# Patient Record
Sex: Female | Born: 1980 | Race: White | Hispanic: No | Marital: Married | Smoking: Never smoker
Health system: Southern US, Community
[De-identification: ages and names within clinical notes are randomized; demographics above are authoritative.]

## PROBLEM LIST (undated history)

## (undated) DIAGNOSIS — F419 Anxiety disorder, unspecified: Secondary | ICD-10-CM

## (undated) DIAGNOSIS — M5126 Other intervertebral disc displacement, lumbar region: Secondary | ICD-10-CM

## (undated) DIAGNOSIS — I10 Essential (primary) hypertension: Secondary | ICD-10-CM

## (undated) DIAGNOSIS — D649 Anemia, unspecified: Secondary | ICD-10-CM

## (undated) HISTORY — PX: LEEP: SHX91

---

## 1998-05-31 ENCOUNTER — Other Ambulatory Visit: Admission: RE | Admit: 1998-05-31 | Discharge: 1998-05-31 | Payer: Self-pay | Admitting: Obstetrics & Gynecology

## 1998-06-04 ENCOUNTER — Observation Stay (HOSPITAL_COMMUNITY): Admission: AD | Admit: 1998-06-04 | Discharge: 1998-06-04 | Payer: Self-pay | Admitting: Obstetrics and Gynecology

## 1999-07-21 ENCOUNTER — Other Ambulatory Visit: Admission: RE | Admit: 1999-07-21 | Discharge: 1999-07-21 | Payer: Self-pay | Admitting: Obstetrics and Gynecology

## 2010-05-23 ENCOUNTER — Emergency Department (HOSPITAL_BASED_OUTPATIENT_CLINIC_OR_DEPARTMENT_OTHER)
Admission: EM | Admit: 2010-05-23 | Discharge: 2010-05-23 | Disposition: A | Discharge status: Home / Self Care | Payer: 59 | Attending: Emergency Medicine | Admitting: Emergency Medicine

## 2010-05-23 DIAGNOSIS — X58XXXA Exposure to other specified factors, initial encounter: Secondary | ICD-10-CM | POA: Insufficient documentation

## 2010-05-23 DIAGNOSIS — S139XXA Sprain of joints and ligaments of unspecified parts of neck, initial encounter: Secondary | ICD-10-CM | POA: Insufficient documentation

## 2017-04-26 ENCOUNTER — Other Ambulatory Visit: Payer: Self-pay | Admitting: Sports Medicine

## 2017-04-26 DIAGNOSIS — M5416 Radiculopathy, lumbar region: Secondary | ICD-10-CM

## 2017-05-01 ENCOUNTER — Ambulatory Visit
Admission: RE | Admit: 2017-05-01 | Discharge: 2017-05-01 | Disposition: A | Discharge status: Home / Self Care | Payer: Self-pay | Source: Ambulatory Visit | Attending: Sports Medicine | Admitting: Sports Medicine

## 2017-05-01 DIAGNOSIS — M5416 Radiculopathy, lumbar region: Secondary | ICD-10-CM

## 2017-05-02 ENCOUNTER — Other Ambulatory Visit: Payer: Self-pay

## 2017-06-03 ENCOUNTER — Other Ambulatory Visit: Payer: Self-pay

## 2017-06-03 ENCOUNTER — Emergency Department (HOSPITAL_BASED_OUTPATIENT_CLINIC_OR_DEPARTMENT_OTHER)
Admission: EM | Admit: 2017-06-03 | Discharge: 2017-06-03 | Disposition: A | Discharge status: Home / Self Care | Payer: Medicaid Other | Attending: Emergency Medicine | Admitting: Emergency Medicine

## 2017-06-03 ENCOUNTER — Encounter (HOSPITAL_BASED_OUTPATIENT_CLINIC_OR_DEPARTMENT_OTHER): Payer: Self-pay | Admitting: Emergency Medicine

## 2017-06-03 DIAGNOSIS — M5126 Other intervertebral disc displacement, lumbar region: Secondary | ICD-10-CM | POA: Insufficient documentation

## 2017-06-03 DIAGNOSIS — M5416 Radiculopathy, lumbar region: Secondary | ICD-10-CM | POA: Insufficient documentation

## 2017-06-03 DIAGNOSIS — M545 Low back pain: Secondary | ICD-10-CM | POA: Diagnosis present

## 2017-06-03 HISTORY — DX: Other intervertebral disc displacement, lumbar region: M51.26

## 2017-06-03 MED ORDER — HYDROMORPHONE HCL 1 MG/ML IJ SOLN
1.0000 mg | Freq: Once | INTRAMUSCULAR | Status: AC
  Administered 2017-06-03: 1 mg via INTRAVENOUS
  Filled 2017-06-03: qty 1

## 2017-06-03 MED ORDER — GABAPENTIN 300 MG PO CAPS
600.0000 mg | ORAL_CAPSULE | Freq: Once | ORAL | Status: AC
  Administered 2017-06-03: 600 mg via ORAL
  Filled 2017-06-03: qty 2

## 2017-06-03 MED ORDER — ONDANSETRON 4 MG PO TBDP
4.0000 mg | ORAL_TABLET | Freq: Once | ORAL | Status: AC
  Administered 2017-06-03: 4 mg via ORAL
  Filled 2017-06-03: qty 1

## 2017-06-03 MED ORDER — OXYCODONE-ACETAMINOPHEN 5-325 MG PO TABS: 1.0000 | ORAL_TABLET | Freq: Four times a day (QID) | ORAL | 0 refills | Status: DC | PRN

## 2017-06-03 MED ORDER — MELOXICAM 15 MG PO TABS: 15.0000 mg | ORAL_TABLET | Freq: Every day | ORAL | 1 refills | Status: AC

## 2017-06-03 MED ORDER — LIDOCAINE 5 % EX PTCH: 1.0000 | MEDICATED_PATCH | CUTANEOUS | 0 refills | Status: DC

## 2017-06-03 MED ORDER — KETOROLAC TROMETHAMINE 30 MG/ML IJ SOLN
30.0000 mg | Freq: Once | INTRAMUSCULAR | Status: AC
  Administered 2017-06-03: 30 mg via INTRAVENOUS
  Filled 2017-06-03: qty 1

## 2017-06-03 MED ORDER — MORPHINE SULFATE (PF) 4 MG/ML IV SOLN
4.0000 mg | Freq: Once | INTRAVENOUS | Status: AC
  Administered 2017-06-03: 4 mg via INTRAVENOUS
  Filled 2017-06-03: qty 1

## 2017-06-03 MED ORDER — GABAPENTIN 400 MG PO CAPS: 400.0000 mg | ORAL_CAPSULE | Freq: Three times a day (TID) | ORAL | 0 refills | Status: DC

## 2017-06-03 MED ORDER — DIAZEPAM 5 MG/ML IJ SOLN
5.0000 mg | Freq: Once | INTRAMUSCULAR | Status: AC
  Administered 2017-06-03: 5 mg via INTRAVENOUS
  Filled 2017-06-03: qty 2

## 2017-06-03 MED ORDER — SODIUM CHLORIDE 0.9 % IV BOLUS (SEPSIS)
1000.0000 mL | Freq: Once | INTRAVENOUS | Status: AC
  Administered 2017-06-03: 1000 mL via INTRAVENOUS

## 2017-06-03 MED ORDER — MELOXICAM 15 MG PO TABS: 15.0000 mg | ORAL_TABLET | Freq: Every day | ORAL | 1 refills | Status: DC

## 2017-06-03 NOTE — ED Provider Notes (Signed)
[facility] EMERGENCY DEPARTMENT Provider Note   CSN: [id number] Arrival date & time: 06/03/17  1537     History   Chief Complaint Chief Complaint  Patient presents with  . Back Pain    HPI Linda Roberts is a 37 y.o. female with recent diagnosis of 2 disc protrusions at L4-5 and L5-S1 who presents with severe back pain preventing her from walking.  Patient reports she received epidural injection on 05/31/2017 and since then has had severe, worsening pain.  She was evaluated at [facility] hospital on 06/01/2017 and had repeat MRI which showed no abscess and no significant change to patient's first MRI on 05/01/2017 which showed a central disc protrusion with inferior migration at L4-5 contacting the descending right L5 nerve root in the right lateral recess, as well as broad right subarticular disc protrusion at L5-S1 contacting and slightly displacing the descending right S1 nerve root.  Patient has had severe right leg pain associated with her back pain.  She denies any numbness.  She denies any saddle anesthesia, bowel or bladder incontinence, or fevers.  She has been taking ibuprofen 800 mg as well as hydrocodone-acetaminophen 5-325 without any relief.  Patient is also been taking gabapentin, Valium, and prednisone taper.  Patient reports she did not feel much better after her visit on Friday, but was able to walk out of the hospital.  Patient is followed by physical medicine and rehabilitation, Dr. Mariel Craft, and had one surgical consultation with Dr. Leeanne Rio.  They advised physical therapy and attempt to decrease the inflammation.  HPI  Past Medical History:  Diagnosis Date  . Ruptured lumbar disc     There are no active problems to display for this patient.   Past Surgical History:  Procedure Laterality Date  . LEEP      OB History    No data available       Home Medications    Prior to Admission medications   Medication Sig Start Date End Date Taking?  Authorizing Provider  gabapentin (NEURONTIN) 400 MG capsule Take 1 capsule (400 mg total) by mouth 3 (three) times daily. 06/03/17   Kindred Heying, Waylan Boga, PA-C  oxyCODONE-acetaminophen (PERCOCET/ROXICET) 5-325 MG tablet Take 1-2 tablets by mouth every 6 (six) hours as needed for severe pain. 06/03/17   Emi Holes, PA-C    Family History No family history on file.  Social History Social History   Tobacco Use  . Smoking status: Never Smoker  . Smokeless tobacco: Never Used  Substance Use Topics  . Alcohol use: No    Frequency: Never  . Drug use: No     Allergies   Patient has no known allergies.   Review of Systems Review of Systems  Constitutional: Negative for chills and fever.  HENT: Negative for facial swelling and sore throat.   Respiratory: Negative for shortness of breath.   Cardiovascular: Negative for chest pain.  Gastrointestinal: Negative for abdominal pain, nausea and vomiting.  Genitourinary: Negative for dysuria.  Musculoskeletal: Positive for back pain.  Skin: Negative for rash and wound.  Neurological: Negative for numbness and headaches.  Psychiatric/Behavioral: The patient is not nervous/anxious.      Physical Exam Updated Vital Signs BP 112/74   Pulse (!) 47   Temp 98.2 F (36.8 C) (Oral)   Resp 16   Ht 5\' 10"  (1.778 m)   Wt 65.8 kg (145 lb)   SpO2 96%   BMI 20.81 kg/m   Physical Exam  Constitutional:  She appears well-developed and well-nourished. No distress.  HENT:  Head: Normocephalic and atraumatic.  Mouth/Throat: Oropharynx is clear and moist. No oropharyngeal exudate.  Eyes: Conjunctivae are normal. Pupils are equal, round, and reactive to light. Right eye exhibits no discharge. Left eye exhibits no discharge. No scleral icterus.  Neck: Normal range of motion. Neck supple. No thyromegaly present.  Cardiovascular: Normal rate, regular rhythm, normal heart sounds and intact distal pulses. Exam reveals no gallop and no friction rub.  No  murmur heard. Pulmonary/Chest: Effort normal and breath sounds normal. No stridor. No respiratory distress. She has no wheezes. She has no rales.  Abdominal: Soft. Bowel sounds are normal. She exhibits no distension. There is no tenderness. There is no rebound and no guarding.  Musculoskeletal:  Edema from L4-S1, no significant midline tenderness, no erythema or warmth; tenderness to the right gluteal region; 5/5 strength to bilateral lower extremities, 2+ patellar reflexes, sensation intact  Lymphadenopathy:    She has no cervical adenopathy.  Neurological: She is alert. Coordination normal.  Skin: Skin is warm and dry. No rash noted. She is not diaphoretic. No pallor.  Psychiatric: She has a normal mood and affect.  Nursing note and vitals reviewed.    ED Treatments / Results  Labs (all labs ordered are listed, but only abnormal results are displayed) Labs Reviewed - No data to display  EKG  EKG Interpretation None       Radiology No results found.  Procedures Procedures (including critical care time)  Medications Ordered in ED Medications  morphine 4 MG/ML injection 4 mg (not administered)  sodium chloride 0.9 % bolus 1,000 mL (0 mLs Intravenous Stopped 06/03/17 1753)  HYDROmorphone (DILAUDID) injection 1 mg (1 mg Intravenous Given 06/03/17 1626)  gabapentin (NEURONTIN) capsule 600 mg (600 mg Oral Given 06/03/17 1903)  diazepam (VALIUM) injection 5 mg (5 mg Intravenous Given 06/03/17 1901)  HYDROmorphone (DILAUDID) injection 1 mg (1 mg Intravenous Given 06/03/17 1939)  ketorolac (TORADOL) 30 MG/ML injection 30 mg (30 mg Intravenous Given 06/03/17 2046)  ondansetron (ZOFRAN-ODT) disintegrating tablet 4 mg (4 mg Oral Given 06/03/17 2121)     Initial Impression / Assessment and Plan / ED Course  I have reviewed the triage vital signs and the nursing notes.  Pertinent labs & imaging results that were available during my care of the patient were reviewed by me and considered in  my medical decision making (see chart for details).     Patient with chronic back pain and R leg pain acutely worsening throughout the week this past week causing her to not be able to walk. Patient had a repeat MRI in the ED at [facility] 2 days ago, after she received an epidural injection 3 days ago, that showed mid increase of protrusion, however otherwise essentially the same as 1 month ago. Today, patient's exam is very reassuring. Strength is 5/5, sensation intact, patellar reflexes intact. No saddle anesthesia or bowel/bladder incontinence. Afebrile. Patient's pain very difficult to control in the ED with Dilaudid, valium, Toradol, and gabapentin. Plan to ambulate and attempt discharge home but patient would like to wait a little while longer to see if the medication helps a little more. At shift change, patient care transferred to [facility], PA-C for continued evaluation, and determination of disposition. Anticipate discharge if patient able to ambulate. Plan to discharge with increase in dose of gabapentin 400mg  TID and Percocet instead of Norco. I reviewed the [place] narcotic database and found no discrepancies. I discussed patient case  with Dr. Dalene SeltzerSchlossman who guided the patient's management and agrees with plan.  Final Clinical Impressions(s) / ED Diagnoses   Final diagnoses:  Protrusion of lumbar intervertebral disc  Lumbar radiculopathy    ED Discharge Orders        Ordered    gabapentin (NEURONTIN) 400 MG capsule  3 times daily,   Status:  Discontinued     06/03/17 2059    oxyCODONE-acetaminophen (PERCOCET/ROXICET) 5-325 MG tablet  Every 6 hours PRN,   Status:  Discontinued     06/03/17 2059    gabapentin (NEURONTIN) 400 MG capsule  3 times daily     06/03/17 2105    oxyCODONE-acetaminophen (PERCOCET/ROXICET) 5-325 MG tablet  Every 6 hours PRN     06/03/17 2105       Emi HolesLaw, Celeste Tavenner M, PA-C 06/03/17 2302    Alvira MondaySchlossman, Erin, MD 06/04/17 1127

## 2017-06-03 NOTE — ED Triage Notes (Signed)
Pt to ED via EMS with c/o back pain; unable to ambulate; received Zofran 4mg  and Fentanyl 100mcg IV; NS 250 ml bolus

## 2017-06-03 NOTE — ED Notes (Signed)
Continue to c/o pain to here lower back that radiates to her right leg.

## 2017-06-03 NOTE — ED Notes (Signed)
Pt ambulated to door frame with assistance of EMT. Pt in excruciating pain, 10/10. Pt complains of nausea and started vomiting as soon as EMT left room. PA aware and putting in orders.

## 2017-06-03 NOTE — ED Notes (Signed)
ED Provider at bedside. 

## 2017-06-03 NOTE — Discharge Instructions (Signed)
You were seen here today for back pain.  I am increasing your Norco to C.H. Robinson WorldwidePerocet. Take as directed.  I am increasing your dose of Gabapentin.  I am changing you from Ibuprofen to Mobic. This is a one daily NSAID.  Continue your steroids at home.  Use lidocaine patches as directed.  Please call your surgeon tomorrow for follow up.  Return for any fever, loss of bowel or bladder function, new weakness or numbness of the lower extremity.

## 2017-06-03 NOTE — ED Notes (Signed)
Patient is able to ben her legs inward but stated that she can not raise it up.  She is in excruciating pain to her right calf.

## 2017-06-03 NOTE — ED Provider Notes (Signed)
Patient signed out to me by Glenford Bayley, PA-C.  Please see her note for further details.  Briefly this is a 37 year old female who was recently diagnosed with 2 bulging disc protrusions at levels of L4-5 and L5-1 who presents with severe back pain, greater on the right with radiation down her right leg. Patient is currently followed by physical medicine and rehabilitation by Dr. Mariel Craft and neurosuergy, Dr. Leeanne Rio.  Patient's current pain medication regimen includes ibuprofen 800 mg, hydrocodone-acetaminophen 5-325, gabapentin, Valium and is currently on a prednisone taper.  Patient did receive a epidural injection on 3/7.  She had MRI on 3/8 that did not show evidence of an abscess or epidural hematoma.  Patient notes that her pain has been increasing over the last several days.  She states that this is been a slow progression, with incidence of bending down to pick up her cat or child making her symptoms worse.  Her pain is most severe with walking.  The patient denies any new numbness/weakness, bowel/bladder incontinence, urinary retention or saddle anesthesia.  She denies any associated fever, IV drug use, abdominal pain, nausea/vomiting or urinary symptoms.  At time of signout the patient care was transferred to myself.  Anticipation of discharge if patient is able to ambulate.  Plan was to discharge the patient with increase in dose of gabapentin 400 mg 3 times daily and to take Percocet instead of Norco.  The case was discussed with Dr. Dalene Seltzer prior to handout.  Physical Exam  BP 112/71 (BP Location: Left Arm)   Pulse 72   Temp 98.2 F (36.8 C) (Oral)   Resp 18   Ht 5\' 10"  (1.778 m)   Wt 65.8 kg (145 lb)   SpO2 100%   BMI 20.81 kg/m   Physical Exam  Constitutional: She appears well-developed and well-nourished. No distress.  Non-toxic appearing.  She is lying on the exam table and appears uncomfortable but in no acute distress.  HENT:  Head: Normocephalic and atraumatic.  Right Ear:  External ear normal.  Left Ear: External ear normal.  Neck: Normal range of motion. Neck supple. No spinous process tenderness present. No neck rigidity. Normal range of motion present.  Cardiovascular: Normal rate, regular rhythm, normal heart sounds and intact distal pulses.  No murmur heard. Pulses:      Radial pulses are 2+ on the right side, and 2+ on the left side.       Femoral pulses are 2+ on the right side, and 2+ on the left side.      Dorsalis pedis pulses are 2+ on the right side, and 2+ on the left side.       Posterior tibial pulses are 2+ on the right side, and 2+ on the left side.  Pulmonary/Chest: Effort normal and breath sounds normal. No respiratory distress.  Abdominal: Soft. Bowel sounds are normal. She exhibits no pulsatile midline mass. There is no tenderness. There is no rigidity, no rebound and no CVA tenderness.  Musculoskeletal:  Posterior and appearance appears normal. No evidence of obvious scoliosis or kyphosis. No obvious signs of skin changes, trauma, deformity, infection.  There is some moderate edema at the level of L4-L5, and L5-S1.  No C, T, or L spine tenderness or step-offs to palpation. No C, T  paraspinal tenderness.  There is diffuse lumbar tenderness bilaterally over the right gluteal region.  Lung expansion normal. Bilateral lower extremity strength 5 out of 5. Patellar and Achilles deep tendon reflex 2+ and equal bilaterally.  Sensation of lower extremities grossly intact. Gait able with mild assistance from husband and patient notes painful. Lower extremity compartments soft. PT and DP 2+ b/l. Cap refill <2 seconds.   Neurological: She is alert.  Skin: Skin is warm, dry and intact. Capillary refill takes less than 2 seconds. No rash noted. She is not diaphoretic. No erythema.  Nursing note and vitals reviewed.   ED Course/Procedures     Procedures No results found for this or any previous visit. No results found.   MDM    Briefly this is a  37 year old female who was recently diagnosed with 2 bulging disc protrusions at levels of L4-5 and L5-1 who presents with severe back pain, greater on the right with radiation down her right leg. Patient is currently followed by physical medicine and rehabilitation by Dr. Mariel CraftBegovich and neurosuergy, Dr. Leeanne Rio'Gara.  Patient's current pain medication regimen includes ibuprofen 800 mg, hydrocodone-acetaminophen 5-325, gabapentin, Valium and is currently on a prednisone taper.  Patient did receive a epidural injection on 3/7.  She had MRI on 3/8 that did not show evidence of an abscess or epidural hematoma.  Patient notes that her pain has been increasing over the last several days.  She states that this is been a slow progression, with incidence of bending down to pick up her cat or child making her symptoms worse.  Her pain is most severe with walking.  The patient denies any new numbness/weakness, bowel/bladder incontinence, urinary retention or saddle anesthesia.  She denies any associated fever, IV drug use, abdominal pain, nausea/vomiting or urinary symptoms.  At time of signout the patient care was transferred to myself.  Anticipation of discharge if patient is able to ambulate.  Plan was to discharge the patient with increase in dose of gabapentin 400 mg 3 times daily and to take Percocet instead of Norco.  The case was discussed with Dr. Dalene SeltzerSchlossman prior to handout.  On my exam there are no neurologic deficits and normal neuro exam.  Patient can walk but states it is painful.  No bowel or bladder incontinence.  No urinary retention or saddle anesthesia.  No concern for cauda equina.  No history of trauma, personal history of cancer, night sweats or weight loss that would warrant a x-ray at this time.  Patient has had several imagings done recently.  Patient has had spinal injections on 3/7 but has had a negative MRI for abscess or epidural hematoma since that time.  No fever or history of IV drug use.  Very low  suspicion for abscess at this time. No pulsatile mass of the abdomen.  No abdominal tenderness to palpation or guarding to make me concern for intra-abdominal pathology.    Patient back pain treated with several doses of pain medication in the department.  This provided her some relief and she was able to ambulate with mild to no assistance.  She tells me that ibuprofen is most helpful for her pain but often finds it to be a hassle to take 3 times per day.  Will change her to once daily Mobic.  As above plan will change patient from Norco to Percocet.  Will increase her dose of gabapentin.  Will also provide lidocaine patches.  Patient and patient's husband are in agreement with plan.  She is to call their neurosurgeon tomorrow discussed that there is seen here today so they can be scheduled for follow-up. Specific return precautions discussed. Time was given for all questions to be answered. The patient  verbalized understanding and agreement with plan. The patient appears safe for discharge home.     Jacinto Halim, PA-C 06/04/17 0120    Alvira Monday, MD 06/04/17 [id number]

## 2017-06-05 ENCOUNTER — Other Ambulatory Visit: Payer: Self-pay | Admitting: Neurosurgery

## 2017-06-08 ENCOUNTER — Other Ambulatory Visit: Payer: Self-pay

## 2017-06-08 ENCOUNTER — Encounter (HOSPITAL_COMMUNITY): Payer: Self-pay | Admitting: *Deleted

## 2017-06-08 NOTE — Progress Notes (Signed)
Spoke with pt for pre-op call. Pt denies cardiac history. Pt states she had HTN when she was 37 years old. States she never took medication, but with weight loss and decrease sodium intake she no longer has any problems.

## 2017-06-11 ENCOUNTER — Ambulatory Visit (HOSPITAL_COMMUNITY)
Admission: RE | Disposition: A | Discharge status: Home / Self Care | Payer: Self-pay | Source: Ambulatory Visit | Attending: Neurosurgery

## 2017-06-11 ENCOUNTER — Ambulatory Visit (HOSPITAL_COMMUNITY): Payer: Medicaid Other | Admitting: Certified Registered Nurse Anesthetist

## 2017-06-11 ENCOUNTER — Ambulatory Visit (HOSPITAL_COMMUNITY)
Admission: RE | Admit: 2017-06-11 | Discharge: 2017-06-12 | Disposition: A | Discharge status: Home / Self Care | Payer: Medicaid Other | Source: Ambulatory Visit | Attending: Neurosurgery | Admitting: Neurosurgery

## 2017-06-11 ENCOUNTER — Ambulatory Visit (HOSPITAL_COMMUNITY): Payer: Medicaid Other

## 2017-06-11 ENCOUNTER — Encounter (HOSPITAL_COMMUNITY): Payer: Self-pay | Admitting: Surgery

## 2017-06-11 ENCOUNTER — Other Ambulatory Visit: Payer: Self-pay

## 2017-06-11 DIAGNOSIS — F419 Anxiety disorder, unspecified: Secondary | ICD-10-CM | POA: Insufficient documentation

## 2017-06-11 DIAGNOSIS — M4807 Spinal stenosis, lumbosacral region: Secondary | ICD-10-CM | POA: Insufficient documentation

## 2017-06-11 DIAGNOSIS — Z793 Long term (current) use of hormonal contraceptives: Secondary | ICD-10-CM | POA: Insufficient documentation

## 2017-06-11 DIAGNOSIS — M5126 Other intervertebral disc displacement, lumbar region: Secondary | ICD-10-CM | POA: Diagnosis present

## 2017-06-11 DIAGNOSIS — M5127 Other intervertebral disc displacement, lumbosacral region: Secondary | ICD-10-CM | POA: Diagnosis not present

## 2017-06-11 DIAGNOSIS — Z79899 Other long term (current) drug therapy: Secondary | ICD-10-CM | POA: Insufficient documentation

## 2017-06-11 DIAGNOSIS — Z419 Encounter for procedure for purposes other than remedying health state, unspecified: Secondary | ICD-10-CM

## 2017-06-11 HISTORY — DX: Anemia, unspecified: D64.9

## 2017-06-11 HISTORY — DX: Anxiety disorder, unspecified: F41.9

## 2017-06-11 HISTORY — DX: Essential (primary) hypertension: I10

## 2017-06-11 HISTORY — PX: LUMBAR LAMINECTOMY/DECOMPRESSION MICRODISCECTOMY: SHX5026

## 2017-06-11 LAB — BASIC METABOLIC PANEL
ANION GAP: 12 (ref 5–15)
BUN: 13 mg/dL (ref 6–20)
CO2: 25 mmol/L (ref 22–32)
CREATININE: 0.79 mg/dL (ref 0.44–1.00)
Calcium: 9 mg/dL (ref 8.9–10.3)
Chloride: 102 mmol/L (ref 101–111)
GFR calc Af Amer: >60 mL/min (ref >60)
GLUCOSE: 85 mg/dL (ref 65–99)
Potassium: 4.4 mmol/L (ref 3.5–5.1)
Sodium: 139 mmol/L (ref 135–145)

## 2017-06-11 LAB — CBC
HCT: 42.3 % (ref 36.0–46.0)
Hemoglobin: 14.2 g/dL (ref 12.0–15.0)
MCH: 31.1 pg (ref 26.0–34.0)
MCHC: 33.6 g/dL (ref 30.0–36.0)
MCV: 92.8 fL (ref 78.0–100.0)
Platelets: 338 10*3/uL (ref 150–400)
RBC: 4.56 MIL/uL (ref 3.87–5.11)
RDW: 13.6 % (ref 11.5–15.5)
WBC: 16.4 10*3/uL — ABNORMAL HIGH (ref 4.0–10.5)

## 2017-06-11 LAB — POCT PREGNANCY, URINE: Preg Test, Ur: NEGATIVE

## 2017-06-11 SURGERY — LUMBAR LAMINECTOMY/DECOMPRESSION MICRODISCECTOMY 1 LEVEL
Anesthesia: General | Site: Back | Laterality: Right

## 2017-06-11 MED ORDER — ROCURONIUM BROMIDE 10 MG/ML (PF) SYRINGE
PREFILLED_SYRINGE | INTRAVENOUS | Status: AC
  Filled 2017-06-11: qty 5

## 2017-06-11 MED ORDER — ALUM & MAG HYDROXIDE-SIMETH 200-200-20 MG/5ML PO SUSP: 30.0000 mL | Freq: Four times a day (QID) | ORAL | Status: DC | PRN

## 2017-06-11 MED ORDER — SUGAMMADEX SODIUM 200 MG/2ML IV SOLN
INTRAVENOUS | Status: DC | PRN
  Administered 2017-06-11: 200 mg via INTRAVENOUS

## 2017-06-11 MED ORDER — BUPIVACAINE HCL (PF) 0.25 % IJ SOLN
INTRAMUSCULAR | Status: AC
  Filled 2017-06-11: qty 30

## 2017-06-11 MED ORDER — SCOPOLAMINE 1 MG/3DAYS TD PT72
1.0000 | MEDICATED_PATCH | Freq: Once | TRANSDERMAL | Status: DC
  Administered 2017-06-11: 1.5 mg via TRANSDERMAL

## 2017-06-11 MED ORDER — HYDROMORPHONE HCL 1 MG/ML IJ SOLN: 0.5000 mg | INTRAMUSCULAR | Status: DC | PRN

## 2017-06-11 MED ORDER — HYDROMORPHONE HCL 1 MG/ML IJ SOLN
0.2500 mg | INTRAMUSCULAR | Status: DC | PRN
  Administered 2017-06-11: 0.5 mg via INTRAVENOUS

## 2017-06-11 MED ORDER — LEVONORGESTREL 20 MCG/24HR IU IUD: 1.0000 | INTRAUTERINE_SYSTEM | Freq: Once | INTRAUTERINE | Status: DC

## 2017-06-11 MED ORDER — SODIUM CHLORIDE 0.9 % IV SOLN: 250.0000 mL | INTRAVENOUS | Status: DC

## 2017-06-11 MED ORDER — CHLORHEXIDINE GLUCONATE CLOTH 2 % EX PADS: 6.0000 | MEDICATED_PAD | Freq: Once | CUTANEOUS | Status: DC

## 2017-06-11 MED ORDER — FENTANYL CITRATE (PF) 100 MCG/2ML IJ SOLN
INTRAMUSCULAR | Status: DC | PRN
  Administered 2017-06-11: 50 ug via INTRAVENOUS
  Administered 2017-06-11 (×2): 100 ug via INTRAVENOUS

## 2017-06-11 MED ORDER — SCOPOLAMINE 1 MG/3DAYS TD PT72
MEDICATED_PATCH | TRANSDERMAL | Status: AC
  Administered 2017-06-11: 1.5 mg via TRANSDERMAL
  Filled 2017-06-11: qty 1

## 2017-06-11 MED ORDER — HYDROMORPHONE HCL 1 MG/ML IJ SOLN
INTRAMUSCULAR | Status: AC
  Filled 2017-06-11: qty 1

## 2017-06-11 MED ORDER — GABAPENTIN 400 MG PO CAPS
400.0000 mg | ORAL_CAPSULE | Freq: Three times a day (TID) | ORAL | Status: DC
  Administered 2017-06-11: 400 mg via ORAL
  Filled 2017-06-11: qty 1

## 2017-06-11 MED ORDER — ROCURONIUM BROMIDE 100 MG/10ML IV SOLN
INTRAVENOUS | Status: DC | PRN
  Administered 2017-06-11: 50 mg via INTRAVENOUS

## 2017-06-11 MED ORDER — CEFAZOLIN SODIUM-DEXTROSE 2-4 GM/100ML-% IV SOLN
2.0000 g | Freq: Three times a day (TID) | INTRAVENOUS | Status: DC
  Administered 2017-06-12: 2 g via INTRAVENOUS
  Filled 2017-06-11: qty 100

## 2017-06-11 MED ORDER — OXYCODONE HCL 5 MG/5ML PO SOLN: 5.0000 mg | Freq: Once | ORAL | Status: DC | PRN

## 2017-06-11 MED ORDER — MELOXICAM 7.5 MG PO TABS: 15.0000 mg | ORAL_TABLET | Freq: Every day | ORAL | Status: DC

## 2017-06-11 MED ORDER — DEXAMETHASONE SODIUM PHOSPHATE 10 MG/ML IJ SOLN
10.0000 mg | INTRAMUSCULAR | Status: DC
  Filled 2017-06-11: qty 1

## 2017-06-11 MED ORDER — MEPERIDINE HCL 50 MG/ML IJ SOLN: 6.2500 mg | INTRAMUSCULAR | Status: DC | PRN

## 2017-06-11 MED ORDER — LACTATED RINGERS IV SOLN
INTRAVENOUS | Status: DC
  Administered 2017-06-11 (×3): via INTRAVENOUS

## 2017-06-11 MED ORDER — ACETAMINOPHEN 325 MG PO TABS: 650.0000 mg | ORAL_TABLET | ORAL | Status: DC | PRN

## 2017-06-11 MED ORDER — ONDANSETRON HCL 4 MG/2ML IJ SOLN
INTRAMUSCULAR | Status: AC
  Filled 2017-06-11: qty 2

## 2017-06-11 MED ORDER — CYCLOBENZAPRINE HCL 10 MG PO TABS
10.0000 mg | ORAL_TABLET | Freq: Three times a day (TID) | ORAL | Status: DC | PRN
  Administered 2017-06-11: 10 mg via ORAL
  Filled 2017-06-11 (×2): qty 1

## 2017-06-11 MED ORDER — ONDANSETRON HCL 4 MG/2ML IJ SOLN: 4.0000 mg | Freq: Once | INTRAMUSCULAR | Status: DC | PRN

## 2017-06-11 MED ORDER — OXYCODONE HCL 5 MG PO TABS: 5.0000 mg | ORAL_TABLET | Freq: Once | ORAL | Status: DC | PRN

## 2017-06-11 MED ORDER — OXYCODONE-ACETAMINOPHEN 5-325 MG PO TABS: 1.0000 | ORAL_TABLET | Freq: Four times a day (QID) | ORAL | Status: DC | PRN

## 2017-06-11 MED ORDER — PROPOFOL 10 MG/ML IV BOLUS
INTRAVENOUS | Status: DC | PRN
  Administered 2017-06-11: 160 mg via INTRAVENOUS

## 2017-06-11 MED ORDER — HYDROMORPHONE HCL 1 MG/ML IJ SOLN
0.2500 mg | INTRAMUSCULAR | Status: DC | PRN
  Administered 2017-06-11 (×3): 0.5 mg via INTRAVENOUS

## 2017-06-11 MED ORDER — ONDANSETRON HCL 4 MG/2ML IJ SOLN: 4.0000 mg | Freq: Four times a day (QID) | INTRAMUSCULAR | Status: DC | PRN

## 2017-06-11 MED ORDER — LIDOCAINE-EPINEPHRINE 1 %-1:100000 IJ SOLN
INTRAMUSCULAR | Status: AC
Start: 2017-06-11 — End: ?
  Filled 2017-06-11: qty 1

## 2017-06-11 MED ORDER — CEFAZOLIN SODIUM-DEXTROSE 2-4 GM/100ML-% IV SOLN
2.0000 g | INTRAVENOUS | Status: AC
  Administered 2017-06-11: 2 g via INTRAVENOUS
  Filled 2017-06-11: qty 100

## 2017-06-11 MED ORDER — OXYCODONE HCL 5 MG PO TABS
10.0000 mg | ORAL_TABLET | ORAL | Status: DC | PRN
  Administered 2017-06-11 – 2017-06-12 (×4): 10 mg via ORAL
  Filled 2017-06-11 (×4): qty 2

## 2017-06-11 MED ORDER — MIDAZOLAM HCL 2 MG/2ML IJ SOLN
INTRAMUSCULAR | Status: AC
  Filled 2017-06-11: qty 2

## 2017-06-11 MED ORDER — ACETAMINOPHEN 650 MG RE SUPP: 650.0000 mg | RECTAL | Status: DC | PRN

## 2017-06-11 MED ORDER — FENTANYL CITRATE (PF) 100 MCG/2ML IJ SOLN
50.0000 ug | Freq: Once | INTRAMUSCULAR | Status: AC
  Administered 2017-06-11: 50 ug via INTRAVENOUS

## 2017-06-11 MED ORDER — PANTOPRAZOLE SODIUM 40 MG IV SOLR
40.0000 mg | Freq: Every day | INTRAVENOUS | Status: DC
  Administered 2017-06-11: 40 mg via INTRAVENOUS
  Filled 2017-06-11: qty 40

## 2017-06-11 MED ORDER — FENTANYL CITRATE (PF) 100 MCG/2ML IJ SOLN
INTRAMUSCULAR | Status: AC
  Administered 2017-06-11: 50 ug via INTRAVENOUS
  Filled 2017-06-11: qty 2

## 2017-06-11 MED ORDER — THROMBIN (RECOMBINANT) 5000 UNITS EX SOLR
CUTANEOUS | Status: DC | PRN
  Administered 2017-06-11 (×2): 5000 [IU] via TOPICAL

## 2017-06-11 MED ORDER — ROCURONIUM BROMIDE 10 MG/ML (PF) SYRINGE
PREFILLED_SYRINGE | INTRAVENOUS | Status: AC
  Filled 2017-06-11: qty 10

## 2017-06-11 MED ORDER — MENTHOL 3 MG MT LOZG: 1.0000 | LOZENGE | OROMUCOSAL | Status: DC | PRN

## 2017-06-11 MED ORDER — PREDNISONE 10 MG PO TABS: 10.0000 mg | ORAL_TABLET | Freq: Every day | ORAL | Status: DC

## 2017-06-11 MED ORDER — LIDOCAINE HCL (CARDIAC) 20 MG/ML IV SOLN
INTRAVENOUS | Status: AC
Start: 2017-06-11 — End: ?
  Filled 2017-06-11: qty 5

## 2017-06-11 MED ORDER — THROMBIN 5000 UNITS EX SOLR
CUTANEOUS | Status: AC
Start: 2017-06-11 — End: ?
  Filled 2017-06-11: qty 10000

## 2017-06-11 MED ORDER — PROMETHAZINE HCL 25 MG/ML IJ SOLN: 6.2500 mg | INTRAMUSCULAR | Status: DC | PRN

## 2017-06-11 MED ORDER — PREDNISONE 10 MG (21) PO TBPK: 20.0000 mg | ORAL_TABLET | Freq: Every day | ORAL | Status: DC

## 2017-06-11 MED ORDER — HEMOSTATIC AGENTS (NO CHARGE) OPTIME
TOPICAL | Status: DC | PRN
  Administered 2017-06-11: 1 via TOPICAL

## 2017-06-11 MED ORDER — SUGAMMADEX SODIUM 200 MG/2ML IV SOLN
INTRAVENOUS | Status: AC
  Filled 2017-06-11: qty 2

## 2017-06-11 MED ORDER — BUPIVACAINE HCL (PF) 0.25 % IJ SOLN
INTRAMUSCULAR | Status: DC | PRN
  Administered 2017-06-11: 10 mL

## 2017-06-11 MED ORDER — SODIUM CHLORIDE 0.9% FLUSH: 3.0000 mL | INTRAVENOUS | Status: DC | PRN

## 2017-06-11 MED ORDER — LIDOCAINE HCL (CARDIAC) 20 MG/ML IV SOLN
INTRAVENOUS | Status: AC
  Filled 2017-06-11: qty 5

## 2017-06-11 MED ORDER — ARTIFICIAL TEARS OPHTHALMIC OINT
TOPICAL_OINTMENT | OPHTHALMIC | Status: AC
  Filled 2017-06-11: qty 10.5

## 2017-06-11 MED ORDER — SODIUM CHLORIDE 0.9% FLUSH: 3.0000 mL | Freq: Two times a day (BID) | INTRAVENOUS | Status: DC

## 2017-06-11 MED ORDER — DEXAMETHASONE SODIUM PHOSPHATE 10 MG/ML IJ SOLN
INTRAMUSCULAR | Status: DC | PRN
Start: 2017-06-11 — End: 2017-06-11
  Administered 2017-06-11: 10 mg via INTRAVENOUS

## 2017-06-11 MED ORDER — MIDAZOLAM HCL 2 MG/2ML IJ SOLN
INTRAMUSCULAR | Status: DC | PRN
  Administered 2017-06-11: 2 mg via INTRAVENOUS

## 2017-06-11 MED ORDER — MIDAZOLAM HCL 2 MG/2ML IJ SOLN: 0.5000 mg | Freq: Once | INTRAMUSCULAR | Status: DC | PRN

## 2017-06-11 MED ORDER — 0.9 % SODIUM CHLORIDE (POUR BTL) OPTIME
TOPICAL | Status: DC | PRN
  Administered 2017-06-11: 1000 mL

## 2017-06-11 MED ORDER — LIDOCAINE 5 % EX PTCH
1.0000 | MEDICATED_PATCH | Freq: Every day | CUTANEOUS | Status: DC
  Filled 2017-06-11: qty 1

## 2017-06-11 MED ORDER — FENTANYL CITRATE (PF) 100 MCG/2ML IJ SOLN
INTRAMUSCULAR | Status: AC
  Filled 2017-06-11: qty 2

## 2017-06-11 MED ORDER — FENTANYL CITRATE (PF) 100 MCG/2ML IJ SOLN
50.0000 ug | Freq: Once | INTRAMUSCULAR | Status: AC
Start: 2017-06-11 — End: 2017-06-11
  Administered 2017-06-11: 50 ug via INTRAVENOUS

## 2017-06-11 MED ORDER — ONDANSETRON HCL 4 MG PO TABS: 4.0000 mg | ORAL_TABLET | Freq: Four times a day (QID) | ORAL | Status: DC | PRN

## 2017-06-11 MED ORDER — PHENOL 1.4 % MT LIQD: 1.0000 | OROMUCOSAL | Status: DC | PRN

## 2017-06-11 MED ORDER — LIDOCAINE 2% (20 MG/ML) 5 ML SYRINGE
INTRAMUSCULAR | Status: DC | PRN
  Administered 2017-06-11: 50 mg via INTRAVENOUS

## 2017-06-11 MED ORDER — FENTANYL CITRATE (PF) 250 MCG/5ML IJ SOLN
INTRAMUSCULAR | Status: AC
  Filled 2017-06-11: qty 5

## 2017-06-11 MED ORDER — LIDOCAINE-EPINEPHRINE 1 %-1:100000 IJ SOLN
INTRAMUSCULAR | Status: DC | PRN
  Administered 2017-06-11: 10 mL via INTRADERMAL

## 2017-06-11 MED ORDER — ONDANSETRON HCL 4 MG/2ML IJ SOLN
INTRAMUSCULAR | Status: DC | PRN
  Administered 2017-06-11: 4 mg via INTRAVENOUS

## 2017-06-11 MED ORDER — DEXAMETHASONE SODIUM PHOSPHATE 10 MG/ML IJ SOLN
INTRAMUSCULAR | Status: AC
  Filled 2017-06-11: qty 1

## 2017-06-11 MED ORDER — SODIUM CHLORIDE 0.9 % IR SOLN
Status: DC | PRN
  Administered 2017-06-11: 500 mL

## 2017-06-11 SURGICAL SUPPLY — 62 items
BAG DECANTER FOR FLEXI CONT (MISCELLANEOUS) ×3 IMPLANT
BENZOIN TINCTURE PRP APPL 2/3 (GAUZE/BANDAGES/DRESSINGS) ×3 IMPLANT
BLADE CLIPPER SURG (BLADE) IMPLANT
BLADE SURG 11 STRL SS (BLADE) ×3 IMPLANT
BUR CUTTER 7.0 ROUND (BURR) ×3 IMPLANT
BUR MATCHSTICK NEURO 3.0 LAGG (BURR) ×3 IMPLANT
CANISTER SUCT 3000ML PPV (MISCELLANEOUS) ×3 IMPLANT
CARTRIDGE OIL MAESTRO DRILL (MISCELLANEOUS) ×1 IMPLANT
CLOSURE STERI-STRIP 1/2X4 (GAUZE/BANDAGES/DRESSINGS) ×1
CLOSURE WOUND 1/2 X4 (GAUZE/BANDAGES/DRESSINGS) ×1
CLSR STERI-STRIP ANTIMIC 1/2X4 (GAUZE/BANDAGES/DRESSINGS) ×2 IMPLANT
DECANTER SPIKE VIAL GLASS SM (MISCELLANEOUS) ×6 IMPLANT
DERMABOND ADVANCED (GAUZE/BANDAGES/DRESSINGS) ×2
DERMABOND ADVANCED .7 DNX12 (GAUZE/BANDAGES/DRESSINGS) ×1 IMPLANT
DIFFUSER DRILL AIR PNEUMATIC (MISCELLANEOUS) ×3 IMPLANT
DRAPE HALF SHEET 40X57 (DRAPES) IMPLANT
DRAPE LAPAROTOMY 100X72X124 (DRAPES) ×3 IMPLANT
DRAPE MICROSCOPE LEICA (MISCELLANEOUS) ×3 IMPLANT
DRAPE SURG 17X23 STRL (DRAPES) ×3 IMPLANT
DRSG OPSITE POSTOP 4X6 (GAUZE/BANDAGES/DRESSINGS) ×3 IMPLANT
DURAPREP 26ML APPLICATOR (WOUND CARE) ×3 IMPLANT
ELECT REM PT RETURN 9FT ADLT (ELECTROSURGICAL) ×3
ELECTRODE REM PT RTRN 9FT ADLT (ELECTROSURGICAL) ×1 IMPLANT
GAUZE SPONGE 4X4 12PLY STRL (GAUZE/BANDAGES/DRESSINGS) IMPLANT
GAUZE SPONGE 4X4 16PLY XRAY LF (GAUZE/BANDAGES/DRESSINGS) IMPLANT
GLOVE BIO SURGEON STRL SZ7 (GLOVE) IMPLANT
GLOVE BIO SURGEON STRL SZ8 (GLOVE) ×3 IMPLANT
GLOVE BIOGEL PI IND STRL 6.5 (GLOVE) ×1 IMPLANT
GLOVE BIOGEL PI IND STRL 7.0 (GLOVE) IMPLANT
GLOVE BIOGEL PI IND STRL 7.5 (GLOVE) ×1 IMPLANT
GLOVE BIOGEL PI INDICATOR 6.5 (GLOVE) ×2
GLOVE BIOGEL PI INDICATOR 7.0 (GLOVE)
GLOVE BIOGEL PI INDICATOR 7.5 (GLOVE) ×2
GLOVE ECLIPSE 7.0 STRL STRAW (GLOVE) ×3 IMPLANT
GLOVE EXAM NITRILE LRG STRL (GLOVE) IMPLANT
GLOVE EXAM NITRILE XL STR (GLOVE) IMPLANT
GLOVE EXAM NITRILE XS STR PU (GLOVE) IMPLANT
GLOVE INDICATOR 8.5 STRL (GLOVE) ×3 IMPLANT
GLOVE SURG SS PI 6.0 STRL IVOR (GLOVE) ×3 IMPLANT
GLOVE SURG SS PI 7.0 STRL IVOR (GLOVE) ×3 IMPLANT
GOWN STRL REUS W/ TWL LRG LVL3 (GOWN DISPOSABLE) ×2 IMPLANT
GOWN STRL REUS W/ TWL XL LVL3 (GOWN DISPOSABLE) ×1 IMPLANT
GOWN STRL REUS W/TWL 2XL LVL3 (GOWN DISPOSABLE) IMPLANT
GOWN STRL REUS W/TWL LRG LVL3 (GOWN DISPOSABLE) ×4
GOWN STRL REUS W/TWL XL LVL3 (GOWN DISPOSABLE) ×2
KIT BASIN OR (CUSTOM PROCEDURE TRAY) ×3 IMPLANT
KIT ROOM TURNOVER OR (KITS) ×3 IMPLANT
NEEDLE HYPO 22GX1.5 SAFETY (NEEDLE) ×3 IMPLANT
NEEDLE SPNL 22GX3.5 QUINCKE BK (NEEDLE) ×3 IMPLANT
NS IRRIG 1000ML POUR BTL (IV SOLUTION) ×3 IMPLANT
OIL CARTRIDGE MAESTRO DRILL (MISCELLANEOUS) ×3
PACK LAMINECTOMY NEURO (CUSTOM PROCEDURE TRAY) ×3 IMPLANT
RUBBERBAND STERILE (MISCELLANEOUS) ×6 IMPLANT
SPONGE SURGIFOAM ABS GEL SZ50 (HEMOSTASIS) ×3 IMPLANT
STRIP CLOSURE SKIN 1/2X4 (GAUZE/BANDAGES/DRESSINGS) ×2 IMPLANT
SUT VIC AB 0 CT1 18XCR BRD8 (SUTURE) ×1 IMPLANT
SUT VIC AB 0 CT1 8-18 (SUTURE) ×2
SUT VIC AB 2-0 CT1 18 (SUTURE) ×3 IMPLANT
SUT VICRYL 4-0 PS2 18IN ABS (SUTURE) ×3 IMPLANT
TOWEL GREEN STERILE (TOWEL DISPOSABLE) ×3 IMPLANT
TOWEL GREEN STERILE FF (TOWEL DISPOSABLE) ×3 IMPLANT
WATER STERILE IRR 1000ML POUR (IV SOLUTION) ×3 IMPLANT

## 2017-06-11 NOTE — Anesthesia Postprocedure Evaluation (Signed)
Anesthesia Post Note  Patient: Linda Roberts  Procedure(s) Performed: Microdiscectomy - right - L5-S1 (Right Back)     Patient location during evaluation: PACU Anesthesia Type: General Level of consciousness: awake and alert Pain management: pain level controlled Vital Signs Assessment: post-procedure vital signs reviewed and stable Respiratory status: spontaneous breathing, nonlabored ventilation, respiratory function stable and patient connected to nasal cannula oxygen Cardiovascular status: blood pressure returned to baseline and stable Postop Assessment: no apparent nausea or vomiting Anesthetic complications: no    Last Vitals:  Vitals:   06/11/17 2030 06/11/17 2050  BP: 114/64 115/79  Pulse: 67 60  Resp: 17 18  Temp: 36.5 C 36.5 C  SpO2: 100% 100%    Last Pain:  Vitals:   06/11/17 2050  TempSrc: Oral  PainSc:                  [name]

## 2017-06-11 NOTE — Anesthesia Procedure Notes (Signed)
Procedure Name: Intubation Date/Time: 06/11/2017 6:37 PM Performed by: Claris Che, CRNA Pre-anesthesia Checklist: Patient identified, Emergency Drugs available, Suction available, Patient being monitored and Timeout performed Patient Re-evaluated:Patient Re-evaluated prior to induction Oxygen Delivery Method: Circle system utilized Induction Type: IV induction Ventilation: Mask ventilation without difficulty Laryngoscope Size: Mac and 4 Grade View: Grade I Tube type: Oral Tube size: 7.5 mm Number of attempts: 1 Airway Equipment and Method: Stylet Placement Confirmation: ETT inserted through vocal cords under direct vision,  positive ETCO2 and breath sounds checked- equal and bilateral Secured at: 23 cm Tube secured with: Tape Dental Injury: Teeth and Oropharynx as per pre-operative assessment

## 2017-06-11 NOTE — Anesthesia Preprocedure Evaluation (Addendum)
Anesthesia Evaluation  Patient identified by MRN, date of birth, ID band Patient awake    Reviewed: Allergy & Precautions, NPO status , Patient's Chart, lab work & pertinent test results  History of Anesthesia Complications Negative for: history of anesthetic complications  Airway Mallampati: I  TM Distance: >3 FB Neck ROM: Full    Dental  (+) Caps, Dental Advisory Given   Pulmonary neg pulmonary ROS,    breath sounds clear to auscultation       Cardiovascular hypertension (no meds presently),  Rhythm:Regular Rate:Normal     Neuro/Psych Disc: back pain    GI/Hepatic Neg liver ROS, GERD  Controlled,  Endo/Other  negative endocrine ROS  Renal/GU negative Renal ROS     Musculoskeletal Chronic back pain: steroids, narcotics   Abdominal   Peds  Hematology negative hematology ROS (+)   Anesthesia Other Findings   Reproductive/Obstetrics mirena                            Anesthesia Physical Anesthesia Plan  ASA: II  Anesthesia Plan: General   Post-op Pain Management:    Induction: Intravenous  PONV Risk Score and Plan: 4 or greater and Scopolamine patch - Pre-op, Dexamethasone and Ondansetron  Airway Management Planned: Oral ETT  Additional Equipment:   Intra-op Plan:   Post-operative Plan: Extubation in OR  Informed Consent: I have reviewed the patients History and Physical, chart, labs and discussed the procedure including the risks, benefits and alternatives for the proposed anesthesia with the patient or authorized representative who has indicated his/her understanding and acceptance.   Dental advisory given  Plan Discussed with: CRNA and Surgeon  Anesthesia Plan Comments: (Plan routine monitors, GETA)        Anesthesia Quick Evaluation

## 2017-06-11 NOTE — Transfer of Care (Signed)
Immediate Anesthesia Transfer of Care Note  Patient: Linda Roberts  Procedure(s) Performed: Microdiscectomy - right - L5-S1 (Right Back)  Patient Location: PACU  Anesthesia Type:General  Level of Consciousness: awake, oriented, sedated, drowsy, patient cooperative and responds to stimulation  Airway & Oxygen Therapy: Patient Spontanous Breathing and Patient connected to nasal cannula oxygen  Post-op Assessment: Report given to RN, Post -op Vital signs reviewed and stable and Patient moving all extremities X 4  Post vital signs: Reviewed and stable  Last Vitals:  Vitals:   06/11/17 1324 06/11/17 1949  BP: 119/75 (P) 120/67  Pulse: (!) 58 (P) 81  Resp: 16 (P) 17  Temp: 36.5 C (!) (P) 36.3 C  SpO2: 100% (P) 100%    Last Pain:  Vitals:   06/11/17 1650  TempSrc:   PainSc: 5       Patients Stated Pain Goal: 4 (06/11/17 1324)  Complications: No apparent anesthesia complications

## 2017-06-11 NOTE — H&P (Signed)
Linda Roberts is an 37 y.o. female.   Chief Complaint: back and right leg pain HPI: 37 year old female with progressive worsening back and right leg pain rating S1 disc distribution. Workup revealed large discoloration with free fragment displacing the right S1 nerve root. Due to patient's progression of clinical syndrome failure conservative treatment imaging findings and recommended a right-sided laminectomy microdiscectomy. I extensively went over the risks and benefits of the operation with the patient as well as perioperative course expectations of outcome and alternatives of surgery and she understood and agreed to proceed forward.  Past Medical History:  Diagnosis Date  . Anemia   . Anxiety   . Hypertension    as a 37 year old - none after losing weight  . Ruptured lumbar disc     Past Surgical History:  Procedure Laterality Date  . LEEP      Family History  Problem Relation Age of Onset  . Arthritis/Rheumatoid Mother   . Dementia Father    Social History:  reports that  has never smoked. she has never used smokeless tobacco. She reports that she does not drink alcohol or use drugs.  Allergies: No Known Allergies  Medications Prior to Admission  Medication Sig Dispense Refill  . gabapentin (NEURONTIN) 400 MG capsule Take 1 capsule (400 mg total) by mouth 3 (three) times daily. 21 capsule 0  . levonorgestrel (MIRENA) 20 MCG/24HR IUD 1 each by Intrauterine route once.    . lidocaine (LIDODERM) 5 % Place 1 patch onto the skin daily. Remove & Discard patch within 12 hours or as directed by MD 30 patch 0  . oxyCODONE-acetaminophen (PERCOCET/ROXICET) 5-325 MG tablet Take 1-2 tablets by mouth every 6 (six) hours as needed for severe pain. 15 tablet 0  . predniSONE (STERAPRED UNI-PAK 21 TAB) 10 MG (21) TBPK tablet Take 10-60 mg by mouth See admin instructions. Take 60 mg by mouth on day 1, take 50 mg by mouth on day 2, take 40 mg by mouth on day 3, take 30 mg by mouth on day 4, take 20  mg by mouth on day 5, take 10 mg by mouth on day 6  0  . meloxicam (MOBIC) 15 MG tablet Take 1 tablet (15 mg total) by mouth daily. (Patient not taking: Reported on 06/05/2017) 30 tablet 1    Results for orders placed or performed during the hospital encounter of 06/11/17 (from the past 48 hour(s))  Pregnancy, urine POC     Status: None   Collection Time: 06/11/17  1:20 PM  Result Value Ref Range   Preg Test, Ur NEGATIVE NEGATIVE    Comment:        THE SENSITIVITY OF THIS METHODOLOGY IS >24 mIU/mL   CBC     Status: Abnormal   Collection Time: 06/11/17  1:22 PM  Result Value Ref Range   WBC 16.4 (H) 4.0 - 10.5 K/uL   RBC 4.56 3.87 - 5.11 MIL/uL   Hemoglobin 14.2 12.0 - 15.0 g/dL   HCT 42.3 36.0 - 46.0 %   MCV 92.8 78.0 - 100.0 fL   MCH 31.1 26.0 - 34.0 pg   MCHC 33.6 30.0 - 36.0 g/dL   RDW 13.6 11.5 - 15.5 %   Platelets 338 150 - 400 K/uL    Comment: Performed at [facility], [place] [address], [place],  [address]  Basic metabolic panel     Status: None   Collection Time: 06/11/17  1:22 PM  Result Value  Ref Range   Sodium 139 135 - 145 mmol/L   Potassium 4.4 3.5 - 5.1 mmol/L   Chloride 102 101 - 111 mmol/L   CO2 25 22 - 32 mmol/L   Glucose, Bld 85 65 - 99 mg/dL   BUN 13 6 - 20 mg/dL   Creatinine, Ser 0.79 0.44 - 1.00 mg/dL   Calcium 9.0 8.9 - 10.3 mg/dL   GFR calc non Af Amer >60 >60 mL/min   GFR calc Af Amer >60 >60 mL/min    Comment: (NOTE) The eGFR has been calculated using the CKD EPI equation. This calculation has not been validated in all clinical situations. eGFR's persistently <60 mL/min signify possible Chronic Kidney Disease.    Anion gap 12 5 - 15    Comment: Performed at [facility] [address], [place], [place] [address]   No results found.  Review of Systems  Musculoskeletal: Positive for back pain and myalgias.  Neurological: Positive for sensory change.    Blood pressure 119/75, pulse (!) 58, temperature 97.7 F (36.5 C),  temperature source Oral, resp. rate 16, weight 65.8 kg (145 lb), SpO2 100 %. Physical Exam  Constitutional: She is oriented to person, place, and time. She appears well-developed and well-nourished.  HENT:  Head: Normocephalic and atraumatic.  Eyes: Pupils are equal, round, and reactive to light.  Neck: Normal range of motion.  Respiratory: Effort normal.  GI: Soft. Bowel sounds are normal.  Neurological: She is alert and oriented to person, place, and time. She has normal strength. GCS eye subscore is 4. GCS verbal subscore is 5. GCS motor subscore is 6.  Strength 5out of 5 iliopsoas, quads, hip she's, gastric, and tibialis, and EHL.  Skin: Skin is warm and dry.     Assessment/Plan 37 year old presents with an acute disc herniation L5-S1 right for microdiscectomy  Yorel Redder P, MD 06/11/2017, 5:43 PM

## 2017-06-11 NOTE — Op Note (Signed)
Preoperative diagnosis: Herniated nucleus with stenosis L5-S1 right  Postoperative diagnosis: Same  Procedure: Lumbar laminectomy microscope discectomy L5-S1 on the right with microdissection of the right S1 nerve root microscopic discectomy  Surgeon: Jillyn HiddenGary Nivin Braniff  Anesthesia: Gen.  EBL: Middle  History of present illness: She is very pleasant 37 year old female is a progress worsening back and right leg pain rating down the back of her leg above her foot. Workup revealed a large disc herniation with free fragments and underneath the right S1 nerve root. Due to patient's progressive conical syndrome failure conservative treatment imaging findings I recommended a lumbar laminectomy microdiscectomy at L5-S1 on the right. I extensively reviewed the risks and benefits of that operation with her as well as perioperative course expectations of outcome and alternatives of surgery and she understands and agrees to proceed forward.  Operative procedure: Patient brought into the or was just a general anesthesia positioned prone the Wilson frame her back was prepped and draped in routine sterile fashion preoperative x-ray localize the appropriate level so after infiltration of 10 mL lidocaine with epi midline incision was made and Bovie light cautery was used to take down the subcutaneous tissues and subperiosteal dissections care lamina of L5 and S1 on the right. Interoperative x-ray confirmed identification proper level so the infra-aspect of lamina of L5 medial facet complex super aspect of lamina of S1 removed with a 3 mm Kerrison punch. Ligament flavum was identified and removed piecemeal fashion. The operating microscope was draped and brought into the field under microscopic illumination the S1 nerve root was identified and the S1 pedicle was identified and the S1 nerve was noted markedly swollen in displayed (from a large free fragment disks at and just inferior to the space underneath the S1 nerve root. A  nerve hook I freed up the free fragment from the undersurface the S1 nerve root and was removed with several large fragments coming out from underneath the root. Disc space was still bulging so this was incised cleanout procedure rongeurs and Epstein curettes. At the end his hip is no further stenosis coronary dilator easily passed underneath the S1 nerve root and thecal sac and out the S1 foramen. There was a go see her get meticulous hemostasis was maintained Gelfoam was opened up the dura the muscle fascia proximally layers with interrupted Vicryl skin was closed running 4 subcuticular Dermabond benzo and Steri-Strips and sterile dressings applied patient recovered in stable position. At the end of case all needle counts sponge counts were correct.

## 2017-06-11 NOTE — Anesthesia Postprocedure Evaluation (Signed)
Anesthesia Post Note  Patient: Linda Roberts  Procedure(s) Performed: Microdiscectomy - right - L5-S1 (Right Back)     Anesthesia Post Evaluation  Last Vitals:  Vitals:   06/11/17 1324 06/11/17 1949  BP: 119/75 (P) 120/67  Pulse: (!) 58 (P) 81  Resp: 16 (P) 17  Temp: 36.5 C (!) (P) 36.3 C  SpO2: 100% (P) 100%    Last Pain:  Vitals:   06/11/17 1650  TempSrc:   PainSc: 5                  Khyleigh Furney

## 2017-06-12 ENCOUNTER — Encounter (HOSPITAL_COMMUNITY): Payer: Self-pay | Admitting: Neurosurgery

## 2017-06-12 DIAGNOSIS — M5127 Other intervertebral disc displacement, lumbosacral region: Secondary | ICD-10-CM | POA: Diagnosis not present

## 2017-06-12 MED ORDER — OXYCODONE HCL 10 MG PO TABS: 10.0000 mg | ORAL_TABLET | ORAL | 0 refills | Status: DC | PRN

## 2017-06-12 MED ORDER — PANTOPRAZOLE SODIUM 40 MG PO TBEC: 40.0000 mg | DELAYED_RELEASE_TABLET | Freq: Every day | ORAL | Status: DC

## 2017-06-12 MED FILL — Thrombin For Soln 5000 Unit: CUTANEOUS | Qty: 2 | Status: AC

## 2017-06-12 NOTE — Discharge Instructions (Signed)

## 2017-06-12 NOTE — Progress Notes (Signed)
Pt doing well. Pt and husband given D/C instructions with Rx, verbal understanding was provided. Pt's IV was removed prior to D/C. Pt's incision is clean and dry with no sign of infection. Pt D/C'd home via wheelchair per MD order. Pt is stable @ D/C and has no other needs at this time. Rema FendtAshley Ula Couvillon, RN

## 2017-06-12 NOTE — Discharge Summary (Signed)
  Physician Discharge Summary  Patient ID: Linda Roberts MRN: [id number] DOB/AGE: 10-03-1980 37 y.o. Estimated body mass index is 20.81 kg/m as calculated from the following:   Height as of 06/03/17: 5\' 10"  (1.778 m).   Weight as of this encounter: 65.8 kg (145 lb).   Admit date: 06/11/2017 Discharge date: 06/12/2017  Admission Diagnoses:herniated nucleus pulposis L5-S1 right  Discharge Diagnoses: same Active Problems:   HNP (herniated nucleus pulposus), lumbar   Discharged Condition: good  Hospital Course: patient admitted hospital underwent lumbar micro-discectomy postop patient did very well recovered covering the floor on the floor was angling and voiding spontaneous he tolerating regular diet stable for discharge home.  Consults: Significant Diagnostic Studies: Treatments:lumbar microdiscectomy L5-S1 right Discharge Exam: Blood pressure (!) 102/55, pulse 62, temperature 98 F (36.7 C), temperature source Oral, resp. rate 18, weight 65.8 kg (145 lb), SpO2 96 %. Strength out of 5 wound clean dry and intact  Disposition: home   Allergies as of 06/12/2017   No Known Allergies     Medication List    TAKE these medications   gabapentin 400 MG capsule Commonly known as:  NEURONTIN Take 1 capsule (400 mg total) by mouth 3 (three) times daily.   levonorgestrel 20 MCG/24HR IUD Commonly known as:  MIRENA 1 each by Intrauterine route once.   lidocaine 5 % Commonly known as:  LIDODERM Place 1 patch onto the skin daily. Remove & Discard patch within 12 hours or as directed by MD   meloxicam 15 MG tablet Commonly known as:  MOBIC Take 1 tablet (15 mg total) by mouth daily.   Oxycodone HCl 10 MG Tabs Take 1 tablet (10 mg total) by mouth every 3 (three) hours as needed for severe pain ((score 7 to 10)).   oxyCODONE-acetaminophen 5-325 MG tablet Commonly known as:  PERCOCET/ROXICET Take 1-2 tablets by mouth every 6 (six) hours as needed for severe pain.   predniSONE 10  MG (21) Tbpk tablet Commonly known as:  STERAPRED UNI-PAK 21 TAB Take 10-60 mg by mouth See admin instructions. Take 60 mg by mouth on day 1, take 50 mg by mouth on day 2, take 40 mg by mouth on day 3, take 30 mg by mouth on day 4, take 20 mg by mouth on day 5, take 10 mg by mouth on day 6        Signed: Christiann Hagerty P 06/12/2017, 8:06 AM

## 2018-08-06 ENCOUNTER — Other Ambulatory Visit: Payer: Self-pay | Admitting: Neurosurgery

## 2018-08-14 NOTE — Pre-Procedure Instructions (Signed)
Jonisha Leppo Dickmann  08/14/2018     [id number] [place], [place] - [address] [place] [address] Phone: [phone] Fax: [phone]   Your procedure is scheduled on Monday, June 1st.  Report to [facility] at 11:00 A.M.  Call this number if you have problems the morning of surgery:  [phone]   Remember:  Do not eat or drink after midnight.    Take these medicines the morning of surgery with A SIP OF WATER gabapentin (NEURONTIN)  If needed - traMADol (ULTRAM)  Follow your surgeon's instructions on when to stop Aspirin.  If no instructions were given by your surgeon then you will need to call the office to get those instructions.    7 days prior to surgery STOP taking meloxicam (MOBIC), any Aspirin (unless otherwise instructed by your surgeon), Aleve, Naproxen, Ibuprofen, Motrin, Advil, Goody's, BC's, all herbal medications, fish oil, and all vitamins.   Do not wear jewelry, make-up or nail polish.  Do not wear lotions, powders, or perfumes, or deodorant.  Do not shave 48 hours prior to surgery.    Do not bring valuables to the hospital.  [facility] is not responsible for any belongings or valuables.  Contacts, dentures or bridgework may not be worn into surgery.  Leave your suitcase in the car.  After surgery it may be brought to your room.  For patients admitted to the hospital, discharge time will be determined by your treatment team.  Patients discharged the day of surgery will not be allowed to drive home.   Special instructions:  [facility]- Preparing For Surgery  Before surgery, you can play an important role. Because skin is not sterile, your skin needs to be as free of germs as possible. You can reduce the number of germs on your skin by washing with CHG (chlorahexidine gluconate) Soap before surgery.  CHG is an antiseptic cleaner which kills germs and bonds with the skin to continue killing germs even after washing.    Oral  Hygiene is also important to reduce your risk of infection.  Remember - BRUSH YOUR TEETH THE MORNING OF SURGERY WITH YOUR REGULAR TOOTHPASTE  Please do not use if you have an allergy to CHG or antibacterial soaps. If your skin becomes reddened/irritated stop using the CHG.  Do not shave (including legs and underarms) for at least 48 hours prior to first CHG shower. It is OK to shave your face.  Please follow these instructions carefully.   1. Shower the NIGHT BEFORE SURGERY and the MORNING OF SURGERY with CHG.   2. If you chose to wash your hair, wash your hair first as usual with your normal shampoo.  3. After you shampoo, rinse your hair and body thoroughly to remove the shampoo.  4. Use CHG as you would any other liquid soap. You can apply CHG directly to the skin and wash gently with a scrungie or a clean washcloth.   5. Apply the CHG Soap to your body ONLY FROM THE NECK DOWN.  Do not use on open wounds or open sores. Avoid contact with your eyes, ears, mouth and genitals (private parts). Wash Face and genitals (private parts)  with your normal soap.  6. Wash thoroughly, paying special attention to the area where your surgery will be performed.  7. Thoroughly rinse your body with warm water from the neck down.  8. DO NOT shower/wash with your normal soap after using  and rinsing off the CHG Soap.  9. Pat yourself dry with a CLEAN TOWEL.  10. Wear CLEAN PAJAMAS to bed the night before surgery, wear comfortable clothes the morning of surgery  11. Place CLEAN SHEETS on your bed the night of your first shower and DO NOT SLEEP WITH PETS.  Day of Surgery:  Do not apply any deodorants/lotions.  Please wear clean clothes to the hospital/surgery center.   Remember to brush your teeth WITH YOUR REGULAR TOOTHPASTE.  Please read over the following fact sheets that you were given. Pain Booklet, Coughing and Deep Breathing, MRSA Information and Surgical Site Infection Prevention

## 2018-08-15 ENCOUNTER — Encounter (HOSPITAL_COMMUNITY)
Admission: RE | Admit: 2018-08-15 | Discharge: 2018-08-15 | Disposition: A | Discharge status: Home / Self Care | Payer: Medicaid Other | Source: Ambulatory Visit | Attending: Neurosurgery | Admitting: Neurosurgery

## 2018-08-15 ENCOUNTER — Other Ambulatory Visit: Payer: Self-pay

## 2018-08-15 ENCOUNTER — Encounter (HOSPITAL_COMMUNITY): Payer: Self-pay

## 2018-08-15 DIAGNOSIS — Z01812 Encounter for preprocedural laboratory examination: Secondary | ICD-10-CM | POA: Diagnosis present

## 2018-08-15 LAB — BASIC METABOLIC PANEL
Anion gap: 9 (ref 5–15)
BUN: 8 mg/dL (ref 6–20)
CO2: 24 mmol/L (ref 22–32)
Calcium: 9.2 mg/dL (ref 8.9–10.3)
Chloride: 105 mmol/L (ref 98–111)
Creatinine, Ser: 1.06 mg/dL — ABNORMAL HIGH (ref 0.44–1.00)
GFR calc Af Amer: >60 mL/min (ref >60)
GFR calc non Af Amer: >60 mL/min (ref >60)
Glucose, Bld: 91 mg/dL (ref 70–99)
Potassium: 3.9 mmol/L (ref 3.5–5.1)
Sodium: 138 mmol/L (ref 135–145)

## 2018-08-15 LAB — CBC
HCT: 39.8 % (ref 36.0–46.0)
Hemoglobin: 13.9 g/dL (ref 12.0–15.0)
MCH: 31.1 pg (ref 26.0–34.0)
MCHC: 34.9 g/dL (ref 30.0–36.0)
MCV: 89 fL (ref 80.0–100.0)
Platelets: 280 10*3/uL (ref 150–400)
RBC: 4.47 MIL/uL (ref 3.87–5.11)
RDW: 11.8 % (ref 11.5–15.5)
WBC: 4.5 10*3/uL (ref 4.0–10.5)
nRBC: 0 % (ref 0.0–0.2)

## 2018-08-15 LAB — SURGICAL PCR SCREEN
MRSA, PCR: NEGATIVE
Staphylococcus aureus: NEGATIVE

## 2018-08-15 NOTE — Progress Notes (Signed)
PCP - none Cardiologist - none  Chest x-ray - n/a EKG - n/a Stress Test - n/a ECHO - n/a Cardiac Cath - n/a  Sleep Study - n/a CPAP - n/a  Fasting Blood Sugar - n/a Checks Blood Sugar _____ times a day  Blood Thinner Instructions:n/a Aspirin Instructions:n/a  Anesthesia review: no  Patient denies shortness of breath, fever, cough and chest pain at PAT appointment   Patient verbalized understanding of instructions that were given to them at the PAT appointment. Patient was also instructed that they will need to review over the PAT instructions again at home before surgery.   Coronavirus Screening  Have you experienced the following symptoms:  Cough NO Fever (>100.66F)  NO Runny nose NO Sore throat NO Difficulty breathing/shortness of breath  NO  Have you or a family member traveled in the last 14 days and where? NO    Patient reminded that hospital visitation restrictions are in effect and the importance of the restrictions.   Pt has an appt on 08/22/18 for Covid 19 testing. Pt instructed to do self guarantine after testing until surgery. She voiced understanding.

## 2018-08-15 NOTE — Pre-Procedure Instructions (Signed)
[place] CONTRERAZ  08/15/2018    Your procedure is scheduled on Monday, August 26, 2018 at 1:00 PM.   Report to [facility] Entrance "A" Admitting Office at 11:00 A.M.   Call this number if you have problems the morning of surgery: [phone]   Questions prior to day of surgery, please call [phone] between 8 & 4 PM.   Remember:  Do not eat or drink after midnight Sunday, 08/25/2018    Take these medicines the morning of surgery with A SIP OF WATER: Gabapentin (Neurontin), Tramadol (Ultram) - if needed.  7 days prior to surgery STOP taking NSAIDS (Mobic, Aleve, Motrin, etc).  Do not use Aspirin Products (Goody's, BC's), Herbal medications, Multivitamins and Fish oil 7 days prior to surgery.   Do not wear jewelry, make-up or nail polish.  Do not wear lotions, powders, perfumes or deodorant.  Do not shave 48 hours prior to surgery.    Do not bring valuables to the hospital.  [facility] is not responsible for any belongings or valuables.  Contacts, dentures or bridgework may not be worn into surgery.  Leave your suitcase in the car.  After surgery it may be brought to your room.  For patients admitted to the hospital, discharge time will be determined by your treatment team.   [facility]- Preparing For Surgery  Before surgery, you can play an important role. Because skin is not sterile, your skin needs to be as free of germs as possible. You can reduce the number of germs on your skin by washing with CHG (chlorahexidine gluconate) Soap before surgery.  CHG is an antiseptic cleaner which kills germs and bonds with the skin to continue killing germs even after washing.    Oral Hygiene is also important to reduce your risk of infection.  Remember - BRUSH YOUR TEETH THE MORNING OF SURGERY WITH YOUR REGULAR TOOTHPASTE  Please do not use if you have an allergy to CHG or antibacterial soaps. If your skin becomes reddened/irritated stop using the CHG.  Do not shave (including legs and  underarms) for at least 48 hours prior to first CHG shower. It is OK to shave your face.  Please follow these instructions carefully.   1. Shower the NIGHT BEFORE SURGERY and the MORNING OF SURGERY with CHG.   2. If you chose to wash your hair, wash your hair first as usual with your normal shampoo.  3. After you shampoo, rinse your hair and body thoroughly to remove the shampoo.  4. Use CHG as you would any other liquid soap. You can apply CHG directly to the skin and wash gently with a scrungie or a clean washcloth.   5. Apply the CHG Soap to your body ONLY FROM THE NECK DOWN.  Do not use on open wounds or open sores. Avoid contact with your eyes, ears, mouth and genitals (private parts). Wash Face and genitals (private parts)  with your normal soap.  6. Wash thoroughly, paying special attention to the area where your surgery will be performed.  7. Thoroughly rinse your body with warm water from the neck down.  8. DO NOT shower/wash with your normal soap after using and rinsing off the CHG Soap.  9. Pat yourself dry with a CLEAN TOWEL.  10. Wear CLEAN PAJAMAS to bed the night before surgery, wear comfortable clothes the morning of surgery  11. Place CLEAN SHEETS on your bed the night of your first shower and DO NOT SLEEP WITH PETS.  Day of Surgery:  Shower as above. Do not apply any deodorants/lotions.  Please wear clean clothes to the hospital.   Remember to brush your teeth WITH YOUR REGULAR TOOTHPASTE.  Please read over the fact sheets that you were given.

## 2018-08-22 ENCOUNTER — Other Ambulatory Visit (HOSPITAL_COMMUNITY)
Admission: RE | Admit: 2018-08-22 | Discharge: 2018-08-22 | Disposition: A | Discharge status: Home / Self Care | Payer: Medicaid Other | Source: Ambulatory Visit | Attending: Neurosurgery | Admitting: Neurosurgery

## 2018-08-22 DIAGNOSIS — Z1159 Encounter for screening for other viral diseases: Secondary | ICD-10-CM | POA: Diagnosis present

## 2018-08-23 LAB — NOVEL CORONAVIRUS, NAA (HOSP ORDER, SEND-OUT TO REF LAB; TAT 18-24 HRS): SARS-CoV-2, NAA: NOT DETECTED

## 2018-08-26 ENCOUNTER — Encounter (HOSPITAL_COMMUNITY)
Admission: RE | Disposition: A | Discharge status: Home / Self Care | Payer: Self-pay | Source: Home / Self Care | Attending: Neurosurgery

## 2018-08-26 ENCOUNTER — Ambulatory Visit (HOSPITAL_COMMUNITY): Payer: Medicaid Other | Admitting: Certified Registered"

## 2018-08-26 ENCOUNTER — Ambulatory Visit (HOSPITAL_COMMUNITY): Payer: Medicaid Other

## 2018-08-26 ENCOUNTER — Ambulatory Visit (HOSPITAL_COMMUNITY)
Admission: RE | Admit: 2018-08-26 | Discharge: 2018-08-27 | Disposition: A | Discharge status: Home / Self Care | Payer: Medicaid Other | Attending: Neurosurgery | Admitting: Neurosurgery

## 2018-08-26 ENCOUNTER — Encounter (HOSPITAL_COMMUNITY): Payer: Self-pay | Admitting: *Deleted

## 2018-08-26 ENCOUNTER — Other Ambulatory Visit: Payer: Self-pay

## 2018-08-26 ENCOUNTER — Ambulatory Visit (HOSPITAL_COMMUNITY): Payer: Medicaid Other | Admitting: Physician Assistant

## 2018-08-26 DIAGNOSIS — M4807 Spinal stenosis, lumbosacral region: Secondary | ICD-10-CM | POA: Diagnosis not present

## 2018-08-26 DIAGNOSIS — Z79899 Other long term (current) drug therapy: Secondary | ICD-10-CM | POA: Insufficient documentation

## 2018-08-26 DIAGNOSIS — Z975 Presence of (intrauterine) contraceptive device: Secondary | ICD-10-CM | POA: Insufficient documentation

## 2018-08-26 DIAGNOSIS — Z793 Long term (current) use of hormonal contraceptives: Secondary | ICD-10-CM | POA: Diagnosis not present

## 2018-08-26 DIAGNOSIS — Z419 Encounter for procedure for purposes other than remedying health state, unspecified: Secondary | ICD-10-CM

## 2018-08-26 DIAGNOSIS — M5117 Intervertebral disc disorders with radiculopathy, lumbosacral region: Secondary | ICD-10-CM | POA: Diagnosis not present

## 2018-08-26 DIAGNOSIS — Z791 Long term (current) use of non-steroidal anti-inflammatories (NSAID): Secondary | ICD-10-CM | POA: Insufficient documentation

## 2018-08-26 DIAGNOSIS — M5126 Other intervertebral disc displacement, lumbar region: Secondary | ICD-10-CM | POA: Diagnosis present

## 2018-08-26 HISTORY — PX: LUMBAR LAMINECTOMY/DECOMPRESSION MICRODISCECTOMY: SHX5026

## 2018-08-26 LAB — POCT PREGNANCY, URINE: Preg Test, Ur: NEGATIVE

## 2018-08-26 SURGERY — LUMBAR LAMINECTOMY/DECOMPRESSION MICRODISCECTOMY 1 LEVEL
Anesthesia: General | Site: Back | Laterality: Right

## 2018-08-26 MED ORDER — LIDOCAINE 2% (20 MG/ML) 5 ML SYRINGE
INTRAMUSCULAR | Status: DC | PRN
  Administered 2018-08-26: 60 mg via INTRAVENOUS

## 2018-08-26 MED ORDER — THROMBIN 5000 UNITS EX SOLR
CUTANEOUS | Status: DC | PRN
  Administered 2018-08-26 (×2): 5000 [IU] via TOPICAL

## 2018-08-26 MED ORDER — PHENYLEPHRINE 40 MCG/ML (10ML) SYRINGE FOR IV PUSH (FOR BLOOD PRESSURE SUPPORT)
PREFILLED_SYRINGE | INTRAVENOUS | Status: AC
  Filled 2018-08-26: qty 10

## 2018-08-26 MED ORDER — FENTANYL CITRATE (PF) 100 MCG/2ML IJ SOLN
INTRAMUSCULAR | Status: AC
  Filled 2018-08-26: qty 2

## 2018-08-26 MED ORDER — PROPOFOL 10 MG/ML IV BOLUS
INTRAVENOUS | Status: AC
  Filled 2018-08-26: qty 20

## 2018-08-26 MED ORDER — GABAPENTIN 300 MG PO CAPS
300.0000 mg | ORAL_CAPSULE | Freq: Three times a day (TID) | ORAL | Status: DC
  Administered 2018-08-26 – 2018-08-27 (×3): 300 mg via ORAL
  Filled 2018-08-26 (×3): qty 1

## 2018-08-26 MED ORDER — ONDANSETRON HCL 4 MG/2ML IJ SOLN
INTRAMUSCULAR | Status: DC | PRN
  Administered 2018-08-26: 4 mg via INTRAVENOUS

## 2018-08-26 MED ORDER — BUPIVACAINE HCL (PF) 0.25 % IJ SOLN
INTRAMUSCULAR | Status: DC | PRN
  Administered 2018-08-26: 10 mL

## 2018-08-26 MED ORDER — FENTANYL CITRATE (PF) 250 MCG/5ML IJ SOLN
INTRAMUSCULAR | Status: AC
  Filled 2018-08-26: qty 5

## 2018-08-26 MED ORDER — DEXAMETHASONE SODIUM PHOSPHATE 10 MG/ML IJ SOLN
INTRAMUSCULAR | Status: AC
  Filled 2018-08-26: qty 1

## 2018-08-26 MED ORDER — MELOXICAM 7.5 MG PO TABS
15.0000 mg | ORAL_TABLET | Freq: Every day | ORAL | Status: DC
  Administered 2018-08-26: 15 mg via ORAL
  Filled 2018-08-26: qty 2

## 2018-08-26 MED ORDER — ROCURONIUM BROMIDE 10 MG/ML (PF) SYRINGE
PREFILLED_SYRINGE | INTRAVENOUS | Status: AC
  Filled 2018-08-26: qty 10

## 2018-08-26 MED ORDER — ACETAMINOPHEN 325 MG PO TABS: 650.0000 mg | ORAL_TABLET | ORAL | Status: DC | PRN

## 2018-08-26 MED ORDER — CEFAZOLIN SODIUM-DEXTROSE 2-4 GM/100ML-% IV SOLN
2.0000 g | INTRAVENOUS | Status: AC
  Administered 2018-08-26: 2 g via INTRAVENOUS
  Filled 2018-08-26: qty 100

## 2018-08-26 MED ORDER — MIDAZOLAM HCL 2 MG/2ML IJ SOLN
INTRAMUSCULAR | Status: AC
  Filled 2018-08-26: qty 2

## 2018-08-26 MED ORDER — SODIUM CHLORIDE 0.9 % IV SOLN
250.0000 mL | INTRAVENOUS | Status: DC
  Administered 2018-08-26: 100 mL via INTRAVENOUS

## 2018-08-26 MED ORDER — CHLORHEXIDINE GLUCONATE CLOTH 2 % EX PADS: 6.0000 | MEDICATED_PAD | Freq: Once | CUTANEOUS | Status: DC

## 2018-08-26 MED ORDER — DEXAMETHASONE SODIUM PHOSPHATE 10 MG/ML IJ SOLN
10.0000 mg | INTRAMUSCULAR | Status: DC
  Filled 2018-08-26: qty 1

## 2018-08-26 MED ORDER — ONDANSETRON HCL 4 MG/2ML IJ SOLN
INTRAMUSCULAR | Status: AC
  Filled 2018-08-26: qty 2

## 2018-08-26 MED ORDER — SODIUM CHLORIDE 0.9% FLUSH
3.0000 mL | Freq: Two times a day (BID) | INTRAVENOUS | Status: DC
  Administered 2018-08-26 – 2018-08-27 (×2): 3 mL via INTRAVENOUS

## 2018-08-26 MED ORDER — 0.9 % SODIUM CHLORIDE (POUR BTL) OPTIME
TOPICAL | Status: DC | PRN
  Administered 2018-08-26: 1000 mL

## 2018-08-26 MED ORDER — LIDOCAINE-EPINEPHRINE 1 %-1:100000 IJ SOLN
INTRAMUSCULAR | Status: DC | PRN
  Administered 2018-08-26: 10 mL

## 2018-08-26 MED ORDER — BUPIVACAINE HCL (PF) 0.25 % IJ SOLN
INTRAMUSCULAR | Status: AC
  Filled 2018-08-26: qty 30

## 2018-08-26 MED ORDER — GLYCOPYRROLATE PF 0.2 MG/ML IJ SOSY
PREFILLED_SYRINGE | INTRAMUSCULAR | Status: AC
  Filled 2018-08-26: qty 1

## 2018-08-26 MED ORDER — FENTANYL CITRATE (PF) 100 MCG/2ML IJ SOLN
25.0000 ug | INTRAMUSCULAR | Status: DC | PRN
  Administered 2018-08-26 (×3): 50 ug via INTRAVENOUS

## 2018-08-26 MED ORDER — DEXAMETHASONE SODIUM PHOSPHATE 10 MG/ML IJ SOLN
INTRAMUSCULAR | Status: DC | PRN
  Administered 2018-08-26: 10 mg via INTRAVENOUS

## 2018-08-26 MED ORDER — FENTANYL CITRATE (PF) 100 MCG/2ML IJ SOLN: 25.0000 ug | INTRAMUSCULAR | Status: DC | PRN

## 2018-08-26 MED ORDER — ONDANSETRON HCL 4 MG PO TABS: 4.0000 mg | ORAL_TABLET | Freq: Four times a day (QID) | ORAL | Status: DC | PRN

## 2018-08-26 MED ORDER — LIDOCAINE-EPINEPHRINE 1 %-1:100000 IJ SOLN
INTRAMUSCULAR | Status: AC
  Filled 2018-08-26: qty 1

## 2018-08-26 MED ORDER — PROPOFOL 10 MG/ML IV BOLUS
INTRAVENOUS | Status: DC | PRN
  Administered 2018-08-26: 150 mg via INTRAVENOUS

## 2018-08-26 MED ORDER — HEMOSTATIC AGENTS (NO CHARGE) OPTIME
TOPICAL | Status: DC | PRN
  Administered 2018-08-26: 1

## 2018-08-26 MED ORDER — HYDROMORPHONE HCL 1 MG/ML IJ SOLN
INTRAMUSCULAR | Status: AC
  Filled 2018-08-26: qty 1

## 2018-08-26 MED ORDER — PANTOPRAZOLE SODIUM 40 MG IV SOLR
40.0000 mg | Freq: Every day | INTRAVENOUS | Status: DC
  Administered 2018-08-26: 21:00:00 40 mg via INTRAVENOUS
  Filled 2018-08-26: qty 40

## 2018-08-26 MED ORDER — THROMBIN 5000 UNITS EX SOLR
CUTANEOUS | Status: AC
  Filled 2018-08-26: qty 10000

## 2018-08-26 MED ORDER — SUGAMMADEX SODIUM 200 MG/2ML IV SOLN
INTRAVENOUS | Status: DC | PRN
  Administered 2018-08-26: 200 mg via INTRAVENOUS

## 2018-08-26 MED ORDER — SUCCINYLCHOLINE CHLORIDE 200 MG/10ML IV SOSY
PREFILLED_SYRINGE | INTRAVENOUS | Status: DC | PRN
  Administered 2018-08-26: 80 mg via INTRAVENOUS

## 2018-08-26 MED ORDER — CEFAZOLIN SODIUM-DEXTROSE 2-4 GM/100ML-% IV SOLN
2.0000 g | Freq: Three times a day (TID) | INTRAVENOUS | Status: AC
  Administered 2018-08-26 (×2): 2 g via INTRAVENOUS
  Filled 2018-08-26 (×2): qty 100

## 2018-08-26 MED ORDER — ONDANSETRON HCL 4 MG/2ML IJ SOLN: 4.0000 mg | Freq: Four times a day (QID) | INTRAMUSCULAR | Status: DC | PRN

## 2018-08-26 MED ORDER — ACETAMINOPHEN 10 MG/ML IV SOLN
INTRAVENOUS | Status: AC
  Filled 2018-08-26: qty 100

## 2018-08-26 MED ORDER — ALUM & MAG HYDROXIDE-SIMETH 200-200-20 MG/5ML PO SUSP: 30.0000 mL | Freq: Four times a day (QID) | ORAL | Status: DC | PRN

## 2018-08-26 MED ORDER — SUCCINYLCHOLINE CHLORIDE 200 MG/10ML IV SOSY
PREFILLED_SYRINGE | INTRAVENOUS | Status: AC
  Filled 2018-08-26: qty 10

## 2018-08-26 MED ORDER — LIDOCAINE 2% (20 MG/ML) 5 ML SYRINGE
INTRAMUSCULAR | Status: AC
  Filled 2018-08-26: qty 5

## 2018-08-26 MED ORDER — OXYCODONE HCL 5 MG PO TABS
10.0000 mg | ORAL_TABLET | ORAL | Status: DC | PRN
  Administered 2018-08-26 – 2018-08-27 (×5): 10 mg via ORAL
  Filled 2018-08-26 (×5): qty 2

## 2018-08-26 MED ORDER — MIDAZOLAM HCL 5 MG/5ML IJ SOLN
INTRAMUSCULAR | Status: DC | PRN
  Administered 2018-08-26: 2 mg via INTRAVENOUS

## 2018-08-26 MED ORDER — MENTHOL 3 MG MT LOZG: 1.0000 | LOZENGE | OROMUCOSAL | Status: DC | PRN

## 2018-08-26 MED ORDER — SODIUM CHLORIDE 0.9% FLUSH: 3.0000 mL | INTRAVENOUS | Status: DC | PRN

## 2018-08-26 MED ORDER — HYDROMORPHONE HCL 1 MG/ML IJ SOLN
0.2500 mg | INTRAMUSCULAR | Status: DC | PRN
  Administered 2018-08-26 (×4): 0.5 mg via INTRAVENOUS

## 2018-08-26 MED ORDER — LACTATED RINGERS IV SOLN
INTRAVENOUS | Status: DC | PRN
  Administered 2018-08-26 (×2): via INTRAVENOUS

## 2018-08-26 MED ORDER — CYCLOBENZAPRINE HCL 10 MG PO TABS
ORAL_TABLET | ORAL | Status: AC
  Filled 2018-08-26: qty 1

## 2018-08-26 MED ORDER — GLYCOPYRROLATE PF 0.2 MG/ML IJ SOSY
PREFILLED_SYRINGE | INTRAMUSCULAR | Status: DC | PRN
  Administered 2018-08-26: .1 mg via INTRAVENOUS

## 2018-08-26 MED ORDER — PHENOL 1.4 % MT LIQD: 1.0000 | OROMUCOSAL | Status: DC | PRN

## 2018-08-26 MED ORDER — LEVONORGESTREL 20 MCG/24HR IU IUD: 1.0000 | INTRAUTERINE_SYSTEM | Freq: Once | INTRAUTERINE | Status: DC

## 2018-08-26 MED ORDER — ACETAMINOPHEN 650 MG RE SUPP: 650.0000 mg | RECTAL | Status: DC | PRN

## 2018-08-26 MED ORDER — SODIUM CHLORIDE 0.9 % IV SOLN
INTRAVENOUS | Status: DC | PRN
  Administered 2018-08-26: 500 mL

## 2018-08-26 MED ORDER — TRAMADOL HCL 50 MG PO TABS
ORAL_TABLET | ORAL | Status: AC
  Filled 2018-08-26: qty 1

## 2018-08-26 MED ORDER — HYDROMORPHONE HCL 1 MG/ML IJ SOLN
1.0000 mg | INTRAMUSCULAR | Status: DC | PRN
  Administered 2018-08-26 (×2): 1 mg via INTRAVENOUS
  Filled 2018-08-26 (×2): qty 1

## 2018-08-26 MED ORDER — ROCURONIUM BROMIDE 50 MG/5ML IV SOSY
PREFILLED_SYRINGE | INTRAVENOUS | Status: DC | PRN
  Administered 2018-08-26: 40 mg via INTRAVENOUS

## 2018-08-26 MED ORDER — FENTANYL CITRATE (PF) 100 MCG/2ML IJ SOLN
INTRAMUSCULAR | Status: DC | PRN
  Administered 2018-08-26: 100 ug via INTRAVENOUS
  Administered 2018-08-26: 150 ug via INTRAVENOUS

## 2018-08-26 MED ORDER — ACETAMINOPHEN 10 MG/ML IV SOLN
1000.0000 mg | Freq: Once | INTRAVENOUS | Status: AC
  Administered 2018-08-26: 1000 mg via INTRAVENOUS

## 2018-08-26 MED ORDER — TRAMADOL HCL 50 MG PO TABS
50.0000 mg | ORAL_TABLET | Freq: Four times a day (QID) | ORAL | Status: DC
  Administered 2018-08-26 – 2018-08-27 (×5): 50 mg via ORAL
  Filled 2018-08-26 (×4): qty 1

## 2018-08-26 MED ORDER — DEXAMETHASONE SODIUM PHOSPHATE 10 MG/ML IJ SOLN
10.0000 mg | Freq: Once | INTRAMUSCULAR | Status: AC
  Administered 2018-08-26: 10 mg via INTRAVENOUS
  Filled 2018-08-26: qty 1

## 2018-08-26 MED ORDER — CYCLOBENZAPRINE HCL 10 MG PO TABS
10.0000 mg | ORAL_TABLET | Freq: Three times a day (TID) | ORAL | Status: DC | PRN
  Administered 2018-08-26 (×2): 10 mg via ORAL
  Filled 2018-08-26: qty 1

## 2018-08-26 SURGICAL SUPPLY — 54 items
BAG DECANTER FOR FLEXI CONT (MISCELLANEOUS) ×3 IMPLANT
BENZOIN TINCTURE PRP APPL 2/3 (GAUZE/BANDAGES/DRESSINGS) ×3 IMPLANT
BLADE CLIPPER SURG (BLADE) IMPLANT
BLADE SURG 11 STRL SS (BLADE) ×3 IMPLANT
BUR CUTTER 7.0 ROUND (BURR) ×3 IMPLANT
BUR MATCHSTICK NEURO 3.0 LAGG (BURR) ×3 IMPLANT
CANISTER SUCT 3000ML PPV (MISCELLANEOUS) ×3 IMPLANT
CARTRIDGE OIL MAESTRO DRILL (MISCELLANEOUS) ×1 IMPLANT
CLOSURE WOUND 1/2 X4 (GAUZE/BANDAGES/DRESSINGS) ×1
COVER WAND RF STERILE (DRAPES) ×3 IMPLANT
DECANTER SPIKE VIAL GLASS SM (MISCELLANEOUS) ×3 IMPLANT
DERMABOND ADVANCED (GAUZE/BANDAGES/DRESSINGS) ×2
DERMABOND ADVANCED .7 DNX12 (GAUZE/BANDAGES/DRESSINGS) ×1 IMPLANT
DIFFUSER DRILL AIR PNEUMATIC (MISCELLANEOUS) ×3 IMPLANT
DRAPE HALF SHEET 40X57 (DRAPES) IMPLANT
DRAPE LAPAROTOMY 100X72X124 (DRAPES) ×3 IMPLANT
DRAPE MICROSCOPE LEICA (MISCELLANEOUS) ×3 IMPLANT
DRAPE SURG 17X23 STRL (DRAPES) ×3 IMPLANT
DRSG OPSITE POSTOP 3X4 (GAUZE/BANDAGES/DRESSINGS) ×3 IMPLANT
DURAPREP 26ML APPLICATOR (WOUND CARE) ×3 IMPLANT
ELECT REM PT RETURN 9FT ADLT (ELECTROSURGICAL) ×3
ELECTRODE REM PT RTRN 9FT ADLT (ELECTROSURGICAL) ×1 IMPLANT
GAUZE 4X4 16PLY RFD (DISPOSABLE) IMPLANT
GAUZE SPONGE 4X4 12PLY STRL (GAUZE/BANDAGES/DRESSINGS) ×3 IMPLANT
GLOVE BIO SURGEON STRL SZ7 (GLOVE) ×6 IMPLANT
GLOVE BIO SURGEON STRL SZ8 (GLOVE) ×3 IMPLANT
GLOVE BIOGEL PI IND STRL 7.0 (GLOVE) IMPLANT
GLOVE BIOGEL PI IND STRL 7.5 (GLOVE) ×1 IMPLANT
GLOVE BIOGEL PI INDICATOR 7.0 (GLOVE)
GLOVE BIOGEL PI INDICATOR 7.5 (GLOVE) ×2
GLOVE EXAM NITRILE XL STR (GLOVE) IMPLANT
GLOVE INDICATOR 8.5 STRL (GLOVE) ×3 IMPLANT
GOWN STRL REUS W/ TWL LRG LVL3 (GOWN DISPOSABLE) ×1 IMPLANT
GOWN STRL REUS W/ TWL XL LVL3 (GOWN DISPOSABLE) ×2 IMPLANT
GOWN STRL REUS W/TWL 2XL LVL3 (GOWN DISPOSABLE) IMPLANT
GOWN STRL REUS W/TWL LRG LVL3 (GOWN DISPOSABLE) ×2
GOWN STRL REUS W/TWL XL LVL3 (GOWN DISPOSABLE) ×4
KIT BASIN OR (CUSTOM PROCEDURE TRAY) ×3 IMPLANT
KIT TURNOVER KIT B (KITS) ×3 IMPLANT
NEEDLE HYPO 22GX1.5 SAFETY (NEEDLE) ×3 IMPLANT
NEEDLE SPNL 22GX3.5 QUINCKE BK (NEEDLE) ×3 IMPLANT
NS IRRIG 1000ML POUR BTL (IV SOLUTION) ×3 IMPLANT
OIL CARTRIDGE MAESTRO DRILL (MISCELLANEOUS) ×3
PACK LAMINECTOMY NEURO (CUSTOM PROCEDURE TRAY) ×3 IMPLANT
RUBBERBAND STERILE (MISCELLANEOUS) ×6 IMPLANT
SPONGE SURGIFOAM ABS GEL SZ50 (HEMOSTASIS) ×3 IMPLANT
STRIP CLOSURE SKIN 1/2X4 (GAUZE/BANDAGES/DRESSINGS) ×2 IMPLANT
SUT VIC AB 0 CT1 18XCR BRD8 (SUTURE) ×1 IMPLANT
SUT VIC AB 0 CT1 8-18 (SUTURE) ×2
SUT VIC AB 2-0 CT1 18 (SUTURE) ×6 IMPLANT
SUT VICRYL 4-0 PS2 18IN ABS (SUTURE) ×3 IMPLANT
TOWEL GREEN STERILE (TOWEL DISPOSABLE) ×3 IMPLANT
TOWEL GREEN STERILE FF (TOWEL DISPOSABLE) ×3 IMPLANT
WATER STERILE IRR 1000ML POUR (IV SOLUTION) ×3 IMPLANT

## 2018-08-26 NOTE — H&P (Signed)
Lennette BihariMary H Fuhrmann is an 38 y.o. female.   Chief Complaint: Back and right hip and leg pain HPI: 38 year old female who is a little over a year out from a lumbar discectomy presented to the office with recurrent back and leg pain work-up revealed a large recurrent disc radiation L5-S1 on the right.  Severe compression and stenosis of the right S1 nerve root.  Due to patient's progression of clinical syndrome imaging findings and failed conservative treatment I recommended redo laminectomy microdiscectomy at L5-S1 on the right.  I have extensively gone over the risks and benefits of the operation with her as well as perioperative course expectations of outcome and alternatives of surgery and she understood and agreed to proceed forward.  Past Medical History:  Diagnosis Date  . Anemia   . Anxiety   . Hypertension    as a 38 year old - none after losing weight  . Ruptured lumbar disc     Past Surgical History:  Procedure Laterality Date  . LEEP    . LUMBAR LAMINECTOMY/DECOMPRESSION MICRODISCECTOMY Right 06/11/2017   Procedure: Microdiscectomy - right - L5-S1;  Surgeon: Donalee Citrinram, Sabrena Gavitt, MD;  [facility];  Service: Neurosurgery;  Laterality: Right;    Family History  Problem Relation Age of Onset  . Arthritis/Rheumatoid Mother   . Dementia Father    Social History:  reports that she has never smoked. She has never used smokeless tobacco. She reports that she does not drink alcohol or use drugs.  Allergies: No Known Allergies  Medications Prior to Admission  Medication Sig Dispense Refill  . gabapentin (NEURONTIN) 300 MG capsule Take 300 mg by mouth 3 (three) times daily.    Marland Kitchen. levonorgestrel (MIRENA) 20 MCG/24HR IUD 1 each by Intrauterine route once.    . meloxicam (MOBIC) 15 MG tablet Take 1 tablet (15 mg total) by mouth daily. (Patient taking differently: Take 15 mg by mouth at bedtime. ) 30 tablet 1  . traMADol (ULTRAM) 50 MG tablet Take 50 mg by mouth every 6 (six) hours.      Results for  orders placed or performed during the hospital encounter of 08/26/18 (from the past 48 hour(s))  Pregnancy, urine POC     Status: None   Collection Time: 08/26/18  5:48 AM  Result Value Ref Range   Preg Test, Ur NEGATIVE NEGATIVE    Comment:        THE SENSITIVITY OF THIS METHODOLOGY IS >24 mIU/mL    No results found.  Review of Systems  Musculoskeletal: Positive for back pain and joint pain.  Neurological: Positive for tingling and sensory change.    Blood pressure 123/67, pulse 92, temperature 98.7 F (37.1 C), temperature source Oral, resp. rate 20, SpO2 100 %. Physical Exam  Constitutional: She is oriented to person, place, and time. She appears well-developed and well-nourished.  HENT:  Head: Normocephalic.  Eyes: Pupils are equal, round, and reactive to light.  Neck: Normal range of motion.  Cardiovascular: Normal rate.  Respiratory: Effort normal.  GI: Soft.  Musculoskeletal: Normal range of motion.  Neurological: She is alert and oriented to person, place, and time. She has normal strength. GCS eye subscore is 4. GCS verbal subscore is 5. GCS motor subscore is 6.  Patient is awake and alert strength is 5-5 iliopsoas, quads, hamstrings, gastroc, into tibialis, and EHL.  Skin: Skin is warm and dry.     Assessment/Plan 38 year old presents for redo lumbar microdiscectomy L5-S1 the right  Lakashia Collison P, MD 08/26/2018,  7:10 AM

## 2018-08-26 NOTE — Transfer of Care (Signed)
Immediate Anesthesia Transfer of Care Note  Patient: Linda Roberts  Procedure(s) Performed: Microdiscectomy - right - L5-S1 redo (Right Back)  Patient Location: PACU  Anesthesia Type:General  Level of Consciousness: awake, alert  and oriented  Airway & Oxygen Therapy: Patient Spontanous Breathing and Patient connected to nasal cannula oxygen  Post-op Assessment: Report given to RN and Post -op Vital signs reviewed and stable  Post vital signs: Reviewed and stable  Last Vitals:  Vitals Value Taken Time  BP 126/75 08/26/2018  9:22 AM  Temp 36.4 C 08/26/2018  9:23 AM  Pulse 73 08/26/2018  9:29 AM  Resp 13 08/26/2018  9:29 AM  SpO2 100 % 08/26/2018  9:29 AM  Vitals shown include unvalidated device data.  Last Pain:  Vitals:   08/26/18 0614  TempSrc: Oral  PainSc:       Patients Stated Pain Goal: 6 (08/26/18 0602)  Complications: No apparent anesthesia complications

## 2018-08-26 NOTE — Anesthesia Preprocedure Evaluation (Addendum)
Anesthesia Evaluation  Patient identified by MRN, date of birth, ID band Patient awake    Reviewed: Allergy & Precautions, NPO status , Patient's Chart, lab work & pertinent test results  Airway Mallampati: I  TM Distance: >3 FB Neck ROM: Full    Dental no notable dental hx. (+) Teeth Intact, Dental Advisory Given   Pulmonary neg pulmonary ROS,    Pulmonary exam normal breath sounds clear to auscultation       Cardiovascular hypertension, negative cardio ROS Normal cardiovascular exam Rhythm:Regular Rate:Normal     Neuro/Psych PSYCHIATRIC DISORDERS negative neurological ROS     GI/Hepatic negative GI ROS, Neg liver ROS,   Endo/Other  negative endocrine ROS  Renal/GU negative Renal ROS  negative genitourinary   Musculoskeletal negative musculoskeletal ROS (+)   Abdominal   Peds negative pediatric ROS (+)  Hematology  (+) Blood dyscrasia, anemia ,   Anesthesia Other Findings   Reproductive/Obstetrics negative OB ROS                            Anesthesia Physical Anesthesia Plan  ASA: II  Anesthesia Plan: General   Post-op Pain Management:    Induction: Intravenous  PONV Risk Score and Plan: 3 and Midazolam, Dexamethasone and Ondansetron  Airway Management Planned: Oral ETT  Additional Equipment:   Intra-op Plan:   Post-operative Plan: Extubation in OR  Informed Consent: I have reviewed the patients History and Physical, chart, labs and discussed the procedure including the risks, benefits and alternatives for the proposed anesthesia with the patient or authorized representative who has indicated his/her understanding and acceptance.     Dental advisory given  Plan Discussed with: CRNA  Anesthesia Plan Comments:         Anesthesia Quick Evaluation

## 2018-08-26 NOTE — Anesthesia Procedure Notes (Signed)
Procedure Name: Intubation Date/Time: 08/26/2018 7:47 AM Performed by: Julian Reil, CRNA Pre-anesthesia Checklist: Patient identified, Emergency Drugs available, Suction available and Patient being monitored Patient Re-evaluated:Patient Re-evaluated prior to induction Oxygen Delivery Method: Circle system utilized Preoxygenation: Pre-oxygenation with 100% oxygen Induction Type: IV induction and Rapid sequence Laryngoscope Size: Miller and 3 Grade View: Grade I Tube type: Oral Tube size: 7.0 mm Number of attempts: 1 Airway Equipment and Method: Stylet Placement Confirmation: ETT inserted through vocal cords under direct vision,  positive ETCO2 and breath sounds checked- equal and bilateral Secured at: 22 cm Tube secured with: Tape Dental Injury: Teeth and Oropharynx as per pre-operative assessment  Comments: RSI due to Covid-19 pandemic concerns.  4x4s bite block used.

## 2018-08-26 NOTE — Op Note (Signed)
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  Due to patient's failed conservative treatment imaging findings and progression of clinical syndrome I recommended reexploration and redo laminectomy microdiscectomy at L5-S1 on the right.  I extensively went over the risks and benefits of the operation with her as well as perioperative course expectations of outcome and alternatives of surgery and she understood and agreed to proceed forward.  Operative procedure: Patient was brought into the OR was used in general seated position prone Wilson frame her back was prepped and draped in routine sterile fashion.  Her old incision was opened up and the scar tissue was dissected free exposing the laminotomy defect at L5-S1 on the right which was confirmed by intraoperative x-ray.  Then utilizing a 1 Penfield and gentle dissectors I exposed the residual laminotomy defect and extend the laminotomy cephalad caudad medially.  I then identified the thecal sac and some native ligament flavum which was removed in piecemeal fashion.  Under microscopic lamination identified the S1 nerve root in the S1 pedicle dissected the S1 nerve root off the S1 pedicle and marching superiorly developing the plane from the thecal sac to a large disc herniation still contained within scar underneath the thecal sac and proximal S1 nerve root.   After clearly identified scar tissue and annulus I excised the space and cleaned out the space of 200 steps and curettes.  There is a dense amount of free fragment disc material but also a large amount of scar and a large piece stuck to the undersurface of the dura and scar so working underneath a layer of scar tissue teased this central fragment off of the ventral thecal sac and removed in piecemeal fashion.  At the end of discectomy there is no further stenosis the S1 foramen easily accepted a coronary dilator angle hockey-stick is also easily able to pass a nerve hook underneath the thecal sac across the midline with no resistance.  Wounds are copious irrigated to Kassim states was maintained Gelfoam was awake top of the dura the muscle fascia approximate layers with Vicryl skin was closed running 4 subcuticular Dermabond benzoin Steri-Strips and a sterile dressing was applied patient recovery in stable condition.  At the end the case all needle count sponge counts were correct.

## 2018-08-27 ENCOUNTER — Encounter (HOSPITAL_COMMUNITY): Payer: Self-pay | Admitting: Neurosurgery

## 2018-08-27 DIAGNOSIS — M5117 Intervertebral disc disorders with radiculopathy, lumbosacral region: Secondary | ICD-10-CM | POA: Diagnosis not present

## 2018-08-27 MED ORDER — HYDROCODONE-ACETAMINOPHEN 5-325 MG PO TABS: 1.0000 | ORAL_TABLET | ORAL | 0 refills | Status: DC | PRN

## 2018-08-27 MED ORDER — PANTOPRAZOLE SODIUM 40 MG PO TBEC: 40.0000 mg | DELAYED_RELEASE_TABLET | Freq: Every day | ORAL | Status: DC

## 2018-08-27 MED ORDER — CYCLOBENZAPRINE HCL 10 MG PO TABS: 10.0000 mg | ORAL_TABLET | Freq: Three times a day (TID) | ORAL | 0 refills | Status: DC | PRN

## 2018-08-27 NOTE — Discharge Summary (Addendum)
Physician Discharge Summary  Patient ID: Linda BihariMary H Budney MRN: [id number] DOB/AGE: May 30, 1980 38 y.o.  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 The patient tolerated the procedure well and was taken to the recovery room and then to the floor in stable condition. The hospital course was routine. There were no complications. The wound remained clean dry and intact. Pt had appropriate back soreness. No complaints of leg pain. SHe does has some new NT in the bottom of her right foot. The patient remained afebrile with stable vital signs, and tolerated a regular diet. The patient continued to increase activities, and pain was well controlled with oral pain medications.   Consults: None  Significant Diagnostic Studies:  Results for orders placed or performed during the hospital encounter of 08/26/18  Pregnancy, urine POC  Result Value Ref Range   Preg Test, Ur NEGATIVE NEGATIVE    Dg Lumbar Spine 1 View  Result Date: 08/26/2018 CLINICAL DATA:  Intraoperative localization EXAM: LUMBAR SPINE - 1 VIEW COMPARISON:  08/01/2018 FINDINGS: Lateral view of the lumbar spine reveals surgical instruments and retractors at the L5-S1 level. Numbering nomenclature is similar to that utilized on prior MRI. IMPRESSION: Intraoperative localization at L5-S1. Electronically Signed   By: Alcide CleverMark  Lukens M.D.   On: 08/26/2018 08:19    Antibiotics:  Anti-infectives (From admission, onward)   Start     Dose/Rate Route Frequency Ordered Stop   08/26/18 1600  ceFAZolin (ANCEF) IVPB 2g/100 mL premix     2 g 200 mL/hr over 30 Minutes Intravenous Every 8 hours 08/26/18 1152 08/26/18 2338   08/26/18 0731  bacitracin  50,000 Units in sodium chloride 0.9 % 500 mL irrigation  Status:  Discontinued       As needed 08/26/18 0732 08/26/18 0918   08/26/18 0600  ceFAZolin (ANCEF) IVPB 2g/100 mL premix     2 g 200 mL/hr over 30 Minutes Intravenous On call to O.R. 08/26/18 0537 08/26/18 0813      Discharge Exam: Blood pressure (!) 92/56, pulse 79, temperature 98.1 F (36.7 C), temperature source Oral, resp. rate 18, SpO2 94 %. Neurologic: Grossly normal Ambulating and voiding well, incision cdi  Discharge Medications:   Allergies as of 08/27/2018   No Known Allergies     Medication List    TAKE these medications   cyclobenzaprine 10 MG tablet Commonly known as:  FLEXERIL Take 1 tablet (10 mg total) by mouth 3 (three) times daily as needed for muscle spasms.   gabapentin 300 MG capsule Commonly known as:  NEURONTIN Take 300 mg by mouth 3 (three) times daily.   HYDROcodone-acetaminophen 5-325 MG tablet Commonly known as:  NORCO/VICODIN Take 1 tablet by mouth every 4 (four) hours as needed for moderate pain.   levonorgestrel 20 MCG/24HR IUD Commonly known as:  MIRENA 1 each by Intrauterine route once.   meloxicam 15 MG tablet Commonly known as:  MOBIC Take 1 tablet (15 mg total) by mouth daily. What changed:  when to take this   traMADol 50 MG tablet Commonly known as:  ULTRAM Take 50 mg by mouth every 6 (six) hours.       Disposition: home   Final Dx:  Redo microdiscectomy L5-S1 on the right  Discharge  Instructions    Call MD for:  difficulty breathing, headache or visual disturbances   Complete by:  As directed    Call MD for:  hives   Complete by:  As directed    Call MD for:  persistant dizziness or light-headedness   Complete by:  As directed    Call MD for:  persistant nausea and vomiting   Complete by:  As directed    Call MD for:  redness, tenderness, or signs of infection (pain, swelling, redness, odor or green/yellow discharge around incision site)   Complete by:  As  directed    Call MD for:  severe uncontrolled pain   Complete by:  As directed    Call MD for:  temperature >100.4   Complete by:  As directed    Diet - low sodium heart healthy   Complete by:  As directed    Driving Restrictions   Complete by:  As directed    No driving for 2 weeks, no riding in the car for 1 week   Increase activity slowly   Complete by:  As directed    Lifting restrictions   Complete by:  As directed    No lifting more than 8 lbs   Remove dressing in 48 hours   Complete by:  As directed          Signed: Tiana Loft Chari Parmenter 08/27/2018, 12:20 PM

## 2018-08-27 NOTE — Anesthesia Postprocedure Evaluation (Signed)
Anesthesia Post Note  Patient: Linda Roberts  Procedure(s) Performed: Microdiscectomy - right - L5-S1 redo (Right Back)     Patient location during evaluation: PACU Anesthesia Type: General Level of consciousness: awake Pain management: pain level controlled Vital Signs Assessment: post-procedure vital signs reviewed and stable Respiratory status: spontaneous breathing Cardiovascular status: stable Postop Assessment: no apparent nausea or vomiting Anesthetic complications: no    Last Vitals:  Vitals:   08/27/18 0331 08/27/18 0819  BP: (!) 99/52 (!) 92/56  Pulse: 74 79  Resp:  18  Temp: 36.7 C 36.7 C  SpO2: 97% 94%    Last Pain:  Vitals:   08/27/18 0819  TempSrc: Oral  PainSc:                  Isac Lincks

## 2019-02-07 ENCOUNTER — Other Ambulatory Visit: Payer: Self-pay

## 2019-02-07 DIAGNOSIS — Z20822 Contact with and (suspected) exposure to covid-19: Secondary | ICD-10-CM

## 2019-02-10 LAB — NOVEL CORONAVIRUS, NAA: SARS-CoV-2, NAA: NOT DETECTED

## 2019-02-18 ENCOUNTER — Other Ambulatory Visit: Payer: Self-pay | Admitting: Neurosurgery

## 2019-03-19 NOTE — Pre-Procedure Instructions (Signed)
[id number] [place], [place] [address] Phone: [phone] Fax: [phone]    Your procedure is scheduled on Mon., Dec. 28, 2020 from 7:30AM-12:01PM  Report to [facility] Entrance "A" at 5:30AM  Call this number if you have problems the morning of surgery:  [phone]   Remember:  Do not eat or drink after midnight on Dec. 27th    Take these medicines the morning of surgery with A SIP OF WATER: TraMADol (ULTRAM)  As of today, stop taking all Aspirin (unless instructed by your doctor) and Other Aspirin containing products, Vitamins, Fish oils, and Herbal medications. Also stop all NSAIDS i.e. Advil, Ibuprofen, Motrin, Aleve, Anaprox, Naproxen, BC, Goody Powders, and all Supplements. Including: Meloxicam (MOBIC)  No Smoking of any kind, Tobacco, or Alcohol products 24 hours prior to your procedure. If you use a Cpap at night, you may bring the machine and all equipment for your overnight stay.   Special instructions:   [facility]- Preparing For Surgery  Before surgery, you can play an important role. Because skin is not sterile, your skin needs to be as free of germs as possible. You can reduce the number of germs on your skin by washing with CHG (chlorahexidine gluconate) Soap before surgery.  CHG is an antiseptic cleaner which kills germs and bonds with the skin to continue killing germs even after washing.    Please do not use if you have an allergy to CHG or antibacterial soaps. If your skin becomes reddened/irritated stop using the CHG.  Do not shave (including legs and underarms) for at least 48 hours prior to first CHG shower. It is OK to shave your face.  Please follow these instructions carefully.   1. Shower the NIGHT BEFORE SURGERY and the MORNING OF SURGERY with CHG.   2. If you chose to wash your hair, wash your hair first as usual with your normal shampoo.  3. After you shampoo, rinse your hair  and body thoroughly to remove the shampoo.  4. Use CHG as you would any other liquid soap. You can apply CHG directly to the skin and wash gently with a scrungie or a clean washcloth.   5. Apply the CHG Soap to your body ONLY FROM THE NECK DOWN.  Do not use on open wounds or open sores. Avoid contact with your eyes, ears, mouth and genitals (private parts). Wash Face and genitals (private parts)  with your normal soap.  6. Wash thoroughly, paying special attention to the area where your surgery will be performed.  7. Thoroughly rinse your body with warm water from the neck down.  8. DO NOT shower/wash with your normal soap after using and rinsing off the CHG Soap.  9. Pat yourself dry with a CLEAN TOWEL.  10. Wear CLEAN PAJAMAS to bed the night before surgery, wear comfortable clothes the morning of surgery  11. Place CLEAN SHEETS on your bed the night of your first shower and DO NOT SLEEP WITH PETS.   Day of Surgery:           Remember to brush your teeth WITH YOUR REGULAR TOOTHPASTE.  Do not wear jewelry, make-up or nail polish.  Do not wear lotions, powders, or perfumes, or deodorant.  Do not shave 48 hours prior to surgery.    Do not bring valuables to the hospital.  [facility] is not responsible for any belongings or valuables.  Contacts, dentures or bridgework may not be worn into surgery.   For patients admitted to the hospital, discharge time will be determined by your treatment team.  Patients discharged the day of surgery will not be allowed to drive home, and someone age 24 and over needs to stay with them for 24 hours.  Please wear clean clothes to the hospital/surgery center.    Please read over the following fact sheets that you were given.

## 2019-03-20 ENCOUNTER — Other Ambulatory Visit: Payer: Self-pay

## 2019-03-20 ENCOUNTER — Encounter (HOSPITAL_COMMUNITY)
Admission: RE | Admit: 2019-03-20 | Discharge: 2019-03-20 | Disposition: A | Discharge status: Home / Self Care | Payer: Medicaid Other | Source: Ambulatory Visit | Attending: Neurosurgery | Admitting: Neurosurgery

## 2019-03-20 ENCOUNTER — Encounter (HOSPITAL_COMMUNITY): Payer: Self-pay

## 2019-03-20 ENCOUNTER — Other Ambulatory Visit (HOSPITAL_COMMUNITY)
Admission: RE | Admit: 2019-03-20 | Discharge: 2019-03-20 | Disposition: A | Discharge status: Home / Self Care | Payer: Medicaid Other | Source: Ambulatory Visit | Attending: Neurosurgery | Admitting: Neurosurgery

## 2019-03-20 DIAGNOSIS — Z20828 Contact with and (suspected) exposure to other viral communicable diseases: Secondary | ICD-10-CM | POA: Diagnosis not present

## 2019-03-20 DIAGNOSIS — Z01818 Encounter for other preprocedural examination: Secondary | ICD-10-CM | POA: Insufficient documentation

## 2019-03-20 LAB — BASIC METABOLIC PANEL
Anion gap: 10 (ref 5–15)
BUN: 7 mg/dL (ref 6–20)
CO2: 24 mmol/L (ref 22–32)
Calcium: 9.2 mg/dL (ref 8.9–10.3)
Chloride: 105 mmol/L (ref 98–111)
Creatinine, Ser: 1.13 mg/dL — ABNORMAL HIGH (ref 0.44–1.00)
GFR calc Af Amer: >60 mL/min (ref >60)
GFR calc non Af Amer: >60 mL/min (ref >60)
Glucose, Bld: 82 mg/dL (ref 70–99)
Potassium: 3.6 mmol/L (ref 3.5–5.1)
Sodium: 139 mmol/L (ref 135–145)

## 2019-03-20 LAB — ABO/RH: ABO/RH(D): B POS

## 2019-03-20 LAB — SURGICAL PCR SCREEN
MRSA, PCR: NEGATIVE
Staphylococcus aureus: NEGATIVE

## 2019-03-20 LAB — CBC
HCT: 39.5 % (ref 36.0–46.0)
Hemoglobin: 13.8 g/dL (ref 12.0–15.0)
MCH: 31 pg (ref 26.0–34.0)
MCHC: 34.9 g/dL (ref 30.0–36.0)
MCV: 88.8 fL (ref 80.0–100.0)
Platelets: 354 10*3/uL (ref 150–400)
RBC: 4.45 MIL/uL (ref 3.87–5.11)
RDW: 12.7 % (ref 11.5–15.5)
WBC: 5.8 10*3/uL (ref 4.0–10.5)
nRBC: 0 % (ref 0.0–0.2)

## 2019-03-20 LAB — TYPE AND SCREEN
ABO/RH(D): B POS
Antibody Screen: NEGATIVE

## 2019-03-20 MED ORDER — CEFAZOLIN SODIUM-DEXTROSE 2-4 GM/100ML-% IV SOLN
2.0000 g | INTRAVENOUS | Status: AC
  Administered 2019-03-24: 2 g via INTRAVENOUS
  Filled 2019-03-20: qty 100

## 2019-03-20 NOTE — Progress Notes (Signed)
PCP - DR. HAWKS   [place] Cardiologist - NA       EKG - TODAY  -   -           Aspirin Instructions:STOP     COVID TEST- TODAY   Anesthesia review: NA  Patient denies shortness of breath, fever, cough and chest pain at PAT appointment   All instructions explained to the patient, with a verbal understanding of the material. Patient agrees to go over the instructions while at home for a better understanding. Patient also instructed to self quarantine after being tested for COVID-19. The opportunity to ask questions was provided.

## 2019-03-21 LAB — NOVEL CORONAVIRUS, NAA (HOSP ORDER, SEND-OUT TO REF LAB; TAT 18-24 HRS): SARS-CoV-2, NAA: NOT DETECTED

## 2019-03-23 NOTE — Anesthesia Preprocedure Evaluation (Addendum)
Anesthesia Evaluation  Patient identified by MRN, date of birth, ID band Patient awake    Reviewed: Allergy & Precautions, NPO status , Patient's Chart, lab work & pertinent test results  Airway Mallampati: II  TM Distance: >3 FB Neck ROM: Full    Dental no notable dental hx. (+) Teeth Intact, Dental Advisory Given   Pulmonary neg pulmonary ROS,    Pulmonary exam normal breath sounds clear to auscultation       Cardiovascular hypertension, Pt. on medications Normal cardiovascular exam Rhythm:Regular Rate:Normal     Neuro/Psych Anxiety negative neurological ROS  negative psych ROS   GI/Hepatic negative GI ROS, Neg liver ROS,   Endo/Other  negative endocrine ROS  Renal/GU K+ 3.6     Musculoskeletal   Abdominal   Peds  Hematology  (+) anemia , T&S   Anesthesia Other Findings   Reproductive/Obstetrics                            Anesthesia Physical Anesthesia Plan  ASA: II  Anesthesia Plan: General   Post-op Pain Management:    Induction: Intravenous  PONV Risk Score and Plan: 4 or greater and Treatment may vary due to age or medical condition, Ondansetron, Dexamethasone and Midazolam  Airway Management Planned: Oral ETT  Additional Equipment: None  Intra-op Plan:   Post-operative Plan: Extubation in OR  Informed Consent: I have reviewed the patients History and Physical, chart, labs and discussed the procedure including the risks, benefits and alternatives for the proposed anesthesia with the patient or authorized representative who has indicated his/her understanding and acceptance.     Dental advisory given  Plan Discussed with: CRNA  Anesthesia Plan Comments: (2 (18g or larger)  GA w 0.5 mg/kg Ketamine)       Anesthesia Quick Evaluation

## 2019-03-24 ENCOUNTER — Inpatient Hospital Stay (HOSPITAL_COMMUNITY): Payer: Medicaid Other

## 2019-03-24 ENCOUNTER — Inpatient Hospital Stay (HOSPITAL_COMMUNITY): Payer: Medicaid Other | Admitting: Anesthesiology

## 2019-03-24 ENCOUNTER — Inpatient Hospital Stay (HOSPITAL_COMMUNITY)
Admission: RE | Admit: 2019-03-24 | Discharge: 2019-03-25 | DRG: 460 | Disposition: A | Discharge status: Home / Self Care | Payer: Medicaid Other | Attending: Neurosurgery | Admitting: Neurosurgery

## 2019-03-24 ENCOUNTER — Encounter (HOSPITAL_COMMUNITY)
Admission: RE | Disposition: A | Discharge status: Home / Self Care | Payer: Self-pay | Source: Home / Self Care | Attending: Neurosurgery

## 2019-03-24 ENCOUNTER — Encounter (HOSPITAL_COMMUNITY): Payer: Self-pay | Admitting: Neurosurgery

## 2019-03-24 ENCOUNTER — Other Ambulatory Visit: Payer: Self-pay

## 2019-03-24 DIAGNOSIS — M47816 Spondylosis without myelopathy or radiculopathy, lumbar region: Secondary | ICD-10-CM | POA: Diagnosis present

## 2019-03-24 DIAGNOSIS — M5126 Other intervertebral disc displacement, lumbar region: Secondary | ICD-10-CM | POA: Diagnosis present

## 2019-03-24 DIAGNOSIS — Z419 Encounter for procedure for purposes other than remedying health state, unspecified: Secondary | ICD-10-CM

## 2019-03-24 DIAGNOSIS — M48061 Spinal stenosis, lumbar region without neurogenic claudication: Secondary | ICD-10-CM | POA: Diagnosis present

## 2019-03-24 DIAGNOSIS — M5136 Other intervertebral disc degeneration, lumbar region: Secondary | ICD-10-CM | POA: Diagnosis present

## 2019-03-24 DIAGNOSIS — M5127 Other intervertebral disc displacement, lumbosacral region: Secondary | ICD-10-CM | POA: Diagnosis present

## 2019-03-24 DIAGNOSIS — I10 Essential (primary) hypertension: Secondary | ICD-10-CM | POA: Diagnosis present

## 2019-03-24 DIAGNOSIS — Z791 Long term (current) use of non-steroidal anti-inflammatories (NSAID): Secondary | ICD-10-CM | POA: Diagnosis not present

## 2019-03-24 DIAGNOSIS — M532X6 Spinal instabilities, lumbar region: Secondary | ICD-10-CM | POA: Diagnosis present

## 2019-03-24 LAB — POCT PREGNANCY, URINE: Preg Test, Ur: NEGATIVE

## 2019-03-24 SURGERY — POSTERIOR LUMBAR FUSION 2 LEVEL
Anesthesia: General | Site: Back

## 2019-03-24 MED ORDER — ACETAMINOPHEN 10 MG/ML IV SOLN: 1000.0000 mg | Freq: Once | INTRAVENOUS | Status: DC | PRN

## 2019-03-24 MED ORDER — MIDAZOLAM HCL 2 MG/2ML IJ SOLN
INTRAMUSCULAR | Status: AC
  Filled 2019-03-24: qty 2

## 2019-03-24 MED ORDER — HEPARIN SODIUM (PORCINE) 1000 UNIT/ML IJ SOLN
INTRAMUSCULAR | Status: DC | PRN
  Administered 2019-03-24: 5000 [IU]

## 2019-03-24 MED ORDER — LEVONORGESTREL 20 MCG/24HR IU IUD: 1.0000 | INTRAUTERINE_SYSTEM | Freq: Once | INTRAUTERINE | Status: DC

## 2019-03-24 MED ORDER — PROPOFOL 10 MG/ML IV BOLUS
INTRAVENOUS | Status: AC
  Filled 2019-03-24: qty 40

## 2019-03-24 MED ORDER — OXYCODONE HCL 5 MG PO TABS
ORAL_TABLET | ORAL | Status: AC
  Filled 2019-03-24: qty 1

## 2019-03-24 MED ORDER — HEPARIN SODIUM (PORCINE) 1000 UNIT/ML IJ SOLN
INTRAMUSCULAR | Status: AC
  Filled 2019-03-24: qty 1

## 2019-03-24 MED ORDER — SODIUM CHLORIDE 0.9% FLUSH: 3.0000 mL | INTRAVENOUS | Status: DC | PRN

## 2019-03-24 MED ORDER — ONDANSETRON HCL 4 MG/2ML IJ SOLN
INTRAMUSCULAR | Status: DC | PRN
  Administered 2019-03-24: 4 mg via INTRAVENOUS

## 2019-03-24 MED ORDER — FENTANYL CITRATE (PF) 250 MCG/5ML IJ SOLN
INTRAMUSCULAR | Status: DC | PRN
  Administered 2019-03-24: 150 ug via INTRAVENOUS
  Administered 2019-03-24: 50 ug via INTRAVENOUS

## 2019-03-24 MED ORDER — LACTATED RINGERS IV SOLN: INTRAVENOUS | Status: DC | PRN

## 2019-03-24 MED ORDER — ACETAMINOPHEN 650 MG RE SUPP: 650.0000 mg | RECTAL | Status: DC | PRN

## 2019-03-24 MED ORDER — KETAMINE HCL 50 MG/5ML IJ SOSY
PREFILLED_SYRINGE | INTRAMUSCULAR | Status: AC
  Filled 2019-03-24: qty 10

## 2019-03-24 MED ORDER — ELDERBERRY 575 MG/5ML PO SYRP: ORAL_SOLUTION | Freq: Every day | ORAL | Status: DC

## 2019-03-24 MED ORDER — PHENOL 1.4 % MT LIQD: 1.0000 | OROMUCOSAL | Status: DC | PRN

## 2019-03-24 MED ORDER — KETAMINE HCL 10 MG/ML IJ SOLN
INTRAMUSCULAR | Status: DC | PRN
  Administered 2019-03-24: 30 mg via INTRAVENOUS
  Administered 2019-03-24: 10 mg via INTRAVENOUS

## 2019-03-24 MED ORDER — SODIUM CHLORIDE 0.9% FLUSH
3.0000 mL | Freq: Two times a day (BID) | INTRAVENOUS | Status: DC
  Administered 2019-03-24: 3 mL via INTRAVENOUS

## 2019-03-24 MED ORDER — METHOCARBAMOL 500 MG PO TABS
500.0000 mg | ORAL_TABLET | Freq: Four times a day (QID) | ORAL | Status: DC | PRN
  Administered 2019-03-24 – 2019-03-25 (×4): 500 mg via ORAL
  Filled 2019-03-24 (×4): qty 1

## 2019-03-24 MED ORDER — CYCLOBENZAPRINE HCL 10 MG PO TABS: 10.0000 mg | ORAL_TABLET | Freq: Three times a day (TID) | ORAL | Status: DC | PRN

## 2019-03-24 MED ORDER — MIDAZOLAM HCL 5 MG/5ML IJ SOLN
INTRAMUSCULAR | Status: DC | PRN
  Administered 2019-03-24: 2 mg via INTRAVENOUS

## 2019-03-24 MED ORDER — ONDANSETRON HCL 4 MG/2ML IJ SOLN: 4.0000 mg | Freq: Four times a day (QID) | INTRAMUSCULAR | Status: DC | PRN

## 2019-03-24 MED ORDER — SODIUM CHLORIDE 0.9 % IV SOLN
250.0000 mL | INTRAVENOUS | Status: DC
  Administered 2019-03-24: 250 mL via INTRAVENOUS

## 2019-03-24 MED ORDER — CEFAZOLIN SODIUM-DEXTROSE 2-4 GM/100ML-% IV SOLN
2.0000 g | Freq: Three times a day (TID) | INTRAVENOUS | Status: AC
  Administered 2019-03-24 (×2): 2 g via INTRAVENOUS
  Filled 2019-03-24 (×2): qty 100

## 2019-03-24 MED ORDER — PROMETHAZINE HCL 25 MG/ML IJ SOLN: 6.2500 mg | INTRAMUSCULAR | Status: DC | PRN

## 2019-03-24 MED ORDER — CHLORHEXIDINE GLUCONATE CLOTH 2 % EX PADS: 6.0000 | MEDICATED_PAD | Freq: Once | CUTANEOUS | Status: DC

## 2019-03-24 MED ORDER — ROCURONIUM BROMIDE 10 MG/ML (PF) SYRINGE
PREFILLED_SYRINGE | INTRAVENOUS | Status: DC | PRN
  Administered 2019-03-24 (×2): 20 mg via INTRAVENOUS
  Administered 2019-03-24: 60 mg via INTRAVENOUS

## 2019-03-24 MED ORDER — ALBUMIN HUMAN 5 % IV SOLN: INTRAVENOUS | Status: DC | PRN

## 2019-03-24 MED ORDER — SODIUM CHLORIDE 0.9 % IV SOLN: 250.0000 mL | INTRAVENOUS | Status: DC

## 2019-03-24 MED ORDER — DEXAMETHASONE SODIUM PHOSPHATE 10 MG/ML IJ SOLN
10.0000 mg | Freq: Once | INTRAMUSCULAR | Status: AC
  Administered 2019-03-24: 10 mg via INTRAVENOUS
  Filled 2019-03-24: qty 1

## 2019-03-24 MED ORDER — ARTHREX ANGEL - ACD-A SOLUTION (CHARTING ONLY) OPTIME
TOPICAL | Status: DC | PRN
  Administered 2019-03-24: 10 mL via TOPICAL

## 2019-03-24 MED ORDER — BUPIVACAINE LIPOSOME 1.3 % IJ SUSP
20.0000 mL | INTRAMUSCULAR | Status: AC
  Administered 2019-03-24: 20 mL
  Filled 2019-03-24: qty 20

## 2019-03-24 MED ORDER — PANTOPRAZOLE SODIUM 40 MG PO TBEC
40.0000 mg | DELAYED_RELEASE_TABLET | Freq: Every day | ORAL | Status: DC
  Administered 2019-03-24 – 2019-03-25 (×2): 40 mg via ORAL
  Filled 2019-03-24 (×2): qty 1

## 2019-03-24 MED ORDER — 0.9 % SODIUM CHLORIDE (POUR BTL) OPTIME
TOPICAL | Status: DC | PRN
  Administered 2019-03-24: 1000 mL

## 2019-03-24 MED ORDER — THROMBIN 20000 UNITS EX SOLR
CUTANEOUS | Status: DC | PRN
  Administered 2019-03-24: 20 mL

## 2019-03-24 MED ORDER — HYDROMORPHONE HCL 1 MG/ML IJ SOLN
0.5000 mg | INTRAMUSCULAR | Status: DC | PRN
  Administered 2019-03-24 (×2): 0.5 mg via INTRAVENOUS
  Filled 2019-03-24 (×2): qty 0.5

## 2019-03-24 MED ORDER — HYDROMORPHONE HCL 1 MG/ML IJ SOLN
INTRAMUSCULAR | Status: AC
  Filled 2019-03-24: qty 1

## 2019-03-24 MED ORDER — PHENYLEPHRINE HCL-NACL 10-0.9 MG/250ML-% IV SOLN
INTRAVENOUS | Status: DC | PRN
  Administered 2019-03-24: 15 ug/min via INTRAVENOUS

## 2019-03-24 MED ORDER — TRAMADOL HCL 50 MG PO TABS
50.0000 mg | ORAL_TABLET | Freq: Two times a day (BID) | ORAL | Status: DC
  Filled 2019-03-24: qty 1

## 2019-03-24 MED ORDER — FENTANYL CITRATE (PF) 250 MCG/5ML IJ SOLN
INTRAMUSCULAR | Status: AC
  Filled 2019-03-24: qty 5

## 2019-03-24 MED ORDER — LIDOCAINE 2% (20 MG/ML) 5 ML SYRINGE
INTRAMUSCULAR | Status: AC
  Filled 2019-03-24: qty 5

## 2019-03-24 MED ORDER — EPHEDRINE 5 MG/ML INJ
INTRAVENOUS | Status: AC
  Filled 2019-03-24: qty 10

## 2019-03-24 MED ORDER — LIDOCAINE-EPINEPHRINE 1 %-1:100000 IJ SOLN
INTRAMUSCULAR | Status: AC
  Filled 2019-03-24: qty 1

## 2019-03-24 MED ORDER — PANTOPRAZOLE SODIUM 40 MG IV SOLR: 40.0000 mg | Freq: Every day | INTRAVENOUS | Status: DC

## 2019-03-24 MED ORDER — MENTHOL 3 MG MT LOZG
1.0000 | LOZENGE | OROMUCOSAL | Status: DC | PRN
  Filled 2019-03-24: qty 9

## 2019-03-24 MED ORDER — THROMBIN 20000 UNITS EX SOLR
CUTANEOUS | Status: AC
  Filled 2019-03-24: qty 20000

## 2019-03-24 MED ORDER — ONDANSETRON HCL 4 MG/2ML IJ SOLN
INTRAMUSCULAR | Status: AC
  Filled 2019-03-24: qty 2

## 2019-03-24 MED ORDER — HYDROMORPHONE HCL 1 MG/ML IJ SOLN
0.2500 mg | INTRAMUSCULAR | Status: DC | PRN
  Administered 2019-03-24 (×4): 0.5 mg via INTRAVENOUS

## 2019-03-24 MED ORDER — HYDROMORPHONE HCL 1 MG/ML IJ SOLN
INTRAMUSCULAR | Status: DC | PRN
  Administered 2019-03-24: .5 mg via INTRAVENOUS

## 2019-03-24 MED ORDER — ALUM & MAG HYDROXIDE-SIMETH 200-200-20 MG/5ML PO SUSP: 30.0000 mL | Freq: Four times a day (QID) | ORAL | Status: DC | PRN

## 2019-03-24 MED ORDER — SUGAMMADEX SODIUM 200 MG/2ML IV SOLN
INTRAVENOUS | Status: DC | PRN
  Administered 2019-03-24: 200 mg via INTRAVENOUS

## 2019-03-24 MED ORDER — SODIUM CHLORIDE 0.9 % IV SOLN
INTRAVENOUS | Status: DC | PRN
  Administered 2019-03-24: 500 mL

## 2019-03-24 MED ORDER — ONDANSETRON 4 MG PO TBDP: 4.0000 mg | ORAL_TABLET | Freq: Three times a day (TID) | ORAL | Status: DC | PRN

## 2019-03-24 MED ORDER — PHENYLEPHRINE 40 MCG/ML (10ML) SYRINGE FOR IV PUSH (FOR BLOOD PRESSURE SUPPORT)
PREFILLED_SYRINGE | INTRAVENOUS | Status: AC
  Filled 2019-03-24: qty 10

## 2019-03-24 MED ORDER — HYDROXYZINE HCL 50 MG/ML IM SOLN
50.0000 mg | Freq: Four times a day (QID) | INTRAMUSCULAR | Status: DC | PRN
  Administered 2019-03-24: 50 mg via INTRAMUSCULAR
  Filled 2019-03-24: qty 1

## 2019-03-24 MED ORDER — OXYCODONE HCL 5 MG PO TABS
10.0000 mg | ORAL_TABLET | ORAL | Status: DC | PRN
  Administered 2019-03-24 – 2019-03-25 (×6): 10 mg via ORAL
  Filled 2019-03-24 (×6): qty 2

## 2019-03-24 MED ORDER — ACETAMINOPHEN 325 MG PO TABS
650.0000 mg | ORAL_TABLET | ORAL | Status: DC | PRN
  Administered 2019-03-24 (×2): 650 mg via ORAL
  Filled 2019-03-24 (×2): qty 2

## 2019-03-24 MED ORDER — OXYCODONE HCL 5 MG PO TABS
5.0000 mg | ORAL_TABLET | Freq: Once | ORAL | Status: AC | PRN
  Administered 2019-03-24: 5 mg via ORAL

## 2019-03-24 MED ORDER — LIDOCAINE 2% (20 MG/ML) 5 ML SYRINGE
INTRAMUSCULAR | Status: DC | PRN
  Administered 2019-03-24: 100 mg via INTRAVENOUS

## 2019-03-24 MED ORDER — PROPOFOL 10 MG/ML IV BOLUS
INTRAVENOUS | Status: DC | PRN
  Administered 2019-03-24: 130 mg via INTRAVENOUS

## 2019-03-24 MED ORDER — LIDOCAINE-EPINEPHRINE 1 %-1:100000 IJ SOLN
INTRAMUSCULAR | Status: DC | PRN
  Administered 2019-03-24: 10 mL

## 2019-03-24 MED ORDER — HYDROMORPHONE HCL 1 MG/ML IJ SOLN
INTRAMUSCULAR | Status: AC
  Filled 2019-03-24: qty 0.5

## 2019-03-24 MED ORDER — ONDANSETRON HCL 4 MG PO TABS: 4.0000 mg | ORAL_TABLET | Freq: Four times a day (QID) | ORAL | Status: DC | PRN

## 2019-03-24 MED ORDER — OXYCODONE HCL 5 MG/5ML PO SOLN: 5.0000 mg | Freq: Once | ORAL | Status: AC | PRN

## 2019-03-24 MED ORDER — EPHEDRINE SULFATE-NACL 50-0.9 MG/10ML-% IV SOSY
PREFILLED_SYRINGE | INTRAVENOUS | Status: DC | PRN
  Administered 2019-03-24 (×2): 5 mg via INTRAVENOUS

## 2019-03-24 MED ORDER — ROCURONIUM BROMIDE 10 MG/ML (PF) SYRINGE
PREFILLED_SYRINGE | INTRAVENOUS | Status: AC
  Filled 2019-03-24: qty 10

## 2019-03-24 MED ORDER — PHENYLEPHRINE 40 MCG/ML (10ML) SYRINGE FOR IV PUSH (FOR BLOOD PRESSURE SUPPORT)
PREFILLED_SYRINGE | INTRAVENOUS | Status: DC | PRN
  Administered 2019-03-24 (×2): 40 ug via INTRAVENOUS
  Administered 2019-03-24 (×2): 80 ug via INTRAVENOUS

## 2019-03-24 MED ORDER — BUPIVACAINE HCL (PF) 0.25 % IJ SOLN
INTRAMUSCULAR | Status: AC
  Filled 2019-03-24: qty 30

## 2019-03-24 SURGICAL SUPPLY — 79 items
BAG DECANTER FOR FLEXI CONT (MISCELLANEOUS) ×3 IMPLANT
BENZOIN TINCTURE PRP APPL 2/3 (GAUZE/BANDAGES/DRESSINGS) ×3 IMPLANT
BLADE CLIPPER SURG (BLADE) IMPLANT
BLADE SURG 11 STRL SS (BLADE) ×3 IMPLANT
BUR CUTTER 7.0 ROUND (BURR) ×3 IMPLANT
BUR MATCHSTICK NEURO 3.0 LAGG (BURR) ×3 IMPLANT
CANISTER SUCT 3000ML PPV (MISCELLANEOUS) ×3 IMPLANT
CAP LOCKING (Cap) ×24 IMPLANT
CAP LOCKING 5.5 CREO (Cap) ×12 IMPLANT
CARTRIDGE OIL MAESTRO DRILL (MISCELLANEOUS) ×1 IMPLANT
CLOSURE WOUND 1/2 X4 (GAUZE/BANDAGES/DRESSINGS) ×1
CONT SPEC 4OZ CLIKSEAL STRL BL (MISCELLANEOUS) ×3 IMPLANT
COVER BACK TABLE 60X90IN (DRAPES) ×3 IMPLANT
COVER WAND RF STERILE (DRAPES) ×3 IMPLANT
DECANTER SPIKE VIAL GLASS SM (MISCELLANEOUS) ×3 IMPLANT
DERMABOND ADVANCED (GAUZE/BANDAGES/DRESSINGS) ×2
DERMABOND ADVANCED .7 DNX12 (GAUZE/BANDAGES/DRESSINGS) ×1 IMPLANT
DIFFUSER DRILL AIR PNEUMATIC (MISCELLANEOUS) ×3 IMPLANT
DRAPE C-ARM 42X72 X-RAY (DRAPES) ×3 IMPLANT
DRAPE C-ARMOR (DRAPES) IMPLANT
DRAPE HALF SHEET 40X57 (DRAPES) IMPLANT
DRAPE LAPAROTOMY 100X72X124 (DRAPES) ×3 IMPLANT
DRAPE SURG 17X23 STRL (DRAPES) ×3 IMPLANT
DRSG OPSITE 4X5.5 SM (GAUZE/BANDAGES/DRESSINGS) ×3 IMPLANT
DRSG OPSITE POSTOP 4X6 (GAUZE/BANDAGES/DRESSINGS) ×3 IMPLANT
DURAPREP 26ML APPLICATOR (WOUND CARE) ×3 IMPLANT
ELECT REM PT RETURN 9FT ADLT (ELECTROSURGICAL) ×3
ELECTRODE REM PT RTRN 9FT ADLT (ELECTROSURGICAL) ×1 IMPLANT
EVACUATOR 1/8 PVC DRAIN (DRAIN) ×3 IMPLANT
EVACUATOR 3/16  PVC DRAIN (DRAIN) ×2
EVACUATOR 3/16 PVC DRAIN (DRAIN) ×1 IMPLANT
GAUZE 4X4 16PLY RFD (DISPOSABLE) IMPLANT
GAUZE SPONGE 4X4 12PLY STRL (GAUZE/BANDAGES/DRESSINGS) ×3 IMPLANT
GLOVE BIO SURGEON STRL SZ7 (GLOVE) IMPLANT
GLOVE BIO SURGEON STRL SZ8 (GLOVE) ×6 IMPLANT
GLOVE BIOGEL PI IND STRL 7.0 (GLOVE) IMPLANT
GLOVE BIOGEL PI INDICATOR 7.0 (GLOVE)
GLOVE EXAM NITRILE XL STR (GLOVE) IMPLANT
GLOVE INDICATOR 8.5 STRL (GLOVE) ×6 IMPLANT
GOWN STRL REUS W/ TWL LRG LVL3 (GOWN DISPOSABLE) IMPLANT
GOWN STRL REUS W/ TWL XL LVL3 (GOWN DISPOSABLE) ×2 IMPLANT
GOWN STRL REUS W/TWL 2XL LVL3 (GOWN DISPOSABLE) IMPLANT
GOWN STRL REUS W/TWL LRG LVL3 (GOWN DISPOSABLE)
GOWN STRL REUS W/TWL XL LVL3 (GOWN DISPOSABLE) ×4
HEMOSTAT POWDER KIT SURGIFOAM (HEMOSTASIS) IMPLANT
KIT BASIN OR (CUSTOM PROCEDURE TRAY) ×3 IMPLANT
KIT BONE MRW ASP ANGEL CPRP (KITS) ×3 IMPLANT
KIT POSITION SURG JACKSON T1 (MISCELLANEOUS) ×3 IMPLANT
KIT TURNOVER KIT B (KITS) ×3 IMPLANT
MILL MEDIUM DISP (BLADE) ×3 IMPLANT
NEEDLE HYPO 18GX1.5 BLUNT FILL (NEEDLE) ×3 IMPLANT
NEEDLE HYPO 21X1.5 SAFETY (NEEDLE) ×3 IMPLANT
NEEDLE HYPO 25X1 1.5 SAFETY (NEEDLE) ×3 IMPLANT
NS IRRIG 1000ML POUR BTL (IV SOLUTION) ×3 IMPLANT
OIL CARTRIDGE MAESTRO DRILL (MISCELLANEOUS) ×3
PACK LAMINECTOMY NEURO (CUSTOM PROCEDURE TRAY) ×3 IMPLANT
PAD ARMBOARD 7.5X6 YLW CONV (MISCELLANEOUS) ×9 IMPLANT
PUTTY DBM ALLOSYNC PURE 10CC (Putty) ×3 IMPLANT
ROD 55MM (Rod) ×2 IMPLANT
ROD CREO 60MM (Rod) ×3 IMPLANT
ROD SPNL CVD 55X5.5XNS TI (Rod) ×1 IMPLANT
SCREW 6.5X5.5 30MM (Screw) ×6 IMPLANT
SHAFT CREO 30MM (Neuro Prosthesis/Implant) ×12 IMPLANT
SPACER SUSTAIN RT 12 8D 9X26 (Spacer) ×3 IMPLANT
SPACER TI SUSTAIN RT 10X26 8D (Spacer) ×3 IMPLANT
SPONGE LAP 4X18 RFD (DISPOSABLE) IMPLANT
SPONGE SURGIFOAM ABS GEL 100 (HEMOSTASIS) ×3 IMPLANT
STRIP CLOSURE SKIN 1/2X4 (GAUZE/BANDAGES/DRESSINGS) ×2 IMPLANT
SUT VIC AB 0 CT1 18XCR BRD8 (SUTURE) ×1 IMPLANT
SUT VIC AB 0 CT1 8-18 (SUTURE) ×2
SUT VIC AB 2-0 CT1 18 (SUTURE) ×3 IMPLANT
SUT VIC AB 4-0 PS2 27 (SUTURE) ×3 IMPLANT
SYR 20ML LL LF (SYRINGE) ×3 IMPLANT
SYR 30ML LL (SYRINGE) ×3 IMPLANT
SYR 5ML LL (SYRINGE) ×3 IMPLANT
TOWEL GREEN STERILE (TOWEL DISPOSABLE) ×3 IMPLANT
TOWEL GREEN STERILE FF (TOWEL DISPOSABLE) ×3 IMPLANT
TRAY FOLEY MTR SLVR 16FR STAT (SET/KITS/TRAYS/PACK) ×3 IMPLANT
WATER STERILE IRR 1000ML POUR (IV SOLUTION) ×3 IMPLANT

## 2019-03-24 NOTE — Op Note (Signed)
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  Decompressive lumbar laminectomy L4-5 with complete medial facetectomies and radical foraminotomies of the L4 and L5 nerve roots  3.  Posterior lumbar interbody fusions L4-5 L5-S1 utilizing the globus insert and rotate titanium cages packed with locally harvested autograft mixed with the Angel B MAC and pure biologic from [employer]  4.  Cortical screw fixation L4-S1 utilizing the globus Creo amp modular cortical screw set  Surgeon: Dominica Severin Evangelyn Crouse  Assistant: Nash Shearer  Anesthesia: General  EBL: Minimal  HPI: 38 year old female with history of 2 previous laminectomies for disc at L5-S1 the right developed a recurrence with severe back and right leg pain.  Due to her progression of clinical syndrome imaging findings failed conservative treatment and the fact this was the third disc herniation at this level I recommended interbody fusion she also had evidence of instability and a large central disc at L4-5.  So due to her progression of clinical syndrome imaging findings for consider treatment of recommended decompression stabilization procedure at both levels.  I extensively reviewed the risks and benefits of that operation with her as well as perioperative course expectations of outcome and alternatives of surgery and she understood and agreed to proceed forward  Operative procedure: Patient brought into the OR was due to general anesthesia positioned prone the Wilson frame her back was prepped and draped in routine sterile fashion.  Roll incision was opened up and extended cephalad  subperiosteal dissection carried lamina of L4 and L5 as well as through the scar tissue L5-S1 on the right I confirmed the appropriate level with intraoperative x-ray I then completely remove the spinous process at L5 but preserve the spinous process at L4 performed complete medial facetectomies around foraminotomies the L4 and L5 nerve roots at L4-5 and then marching inferiorly working through the scar tissue freed up the L5-S1 nerve roots with complete medial facetectomies and foraminotomies bilaterally at L5-S1.  At this point tension taken the interbody work disc base at first working at L5-S1 was coagulated cleaned out large recurrent disc herniation was removed in the right side as well as some disc in the central compartment was removed and the left this decompressed the thecal sac and primarily the right L5 and S1 nerve roots.  After complete discectomy and adequate endplate preparation was achieved utilizing sequential distraction I selected a 9-12 8 degree implant packed with locally harvested autograft mixed prior upon exposure I had aspirated bone marrow and this had been concentrated and mixed with the [employer] pure this was all mixed with the autograft.  This was all packed in the cage I packed centrally as well as prior to packing and and inserted in the contralateral cage.  Fluoroscopy confirmed good position of all the implants.  Then the L4-5 in a similar fashion the space was cleaned out and endplates were prepared both cages were inserted with an extensive the autograft mixed packed centrally.  Then under fluoroscopy cortical screws were placed in routine fashion utilizing the 5-7 o'clock positions at the L4 screws direct visualization of L5 and S1.  After all screws in place postop fluoroscopy confirmed good position of all implants.  Then the wound scopes  irrigated to Kassim states was maintained 55 mm rod was placed on the left to 60 mm on the right all anchoring knots were placed I compress  the L5 screw against S1 and the L4 against L5.  Inspected all the foramina to confirm patency and no migration of graft material leg Gelfoam of the dura inserted a medium Hemovac drain and injected Exparel in the fascia.  The wounds then closed in layers with Vicryl skin was closed running 4 subcuticular Dermabond benzoin Steri-Strips and a sterile dressing was applied patient recovery in stable condition.  At the end the case all needle count sponge counts were correct.

## 2019-03-24 NOTE — Anesthesia Postprocedure Evaluation (Signed)
Anesthesia Post Note  Patient: [place]  Procedure(s) Performed: Posterior Lumbar Interbody Fusion - Lumbar Four-Lumbar Five - Lumbar Five-Sacral One (N/A Back)     Patient location during evaluation: PACU Anesthesia Type: General Level of consciousness: awake and alert Pain management: pain level controlled Vital Signs Assessment: post-procedure vital signs reviewed and stable Respiratory status: spontaneous breathing, nonlabored ventilation, respiratory function stable and patient connected to nasal cannula oxygen Cardiovascular status: blood pressure returned to baseline and stable Postop Assessment: no apparent nausea or vomiting Anesthetic complications: no    Last Vitals:  Vitals:   03/24/19 1402 03/24/19 1633  BP: (!) 91/56 106/66  Pulse: 69 73  Resp: 17 18  Temp: 37 C   SpO2: 99% 95%    Last Pain:  Vitals:   03/24/19 1402  TempSrc: Oral  PainSc:                  [place]

## 2019-03-24 NOTE — Transfer of Care (Signed)
Immediate Anesthesia Transfer of Care Note  Patient: [place]  Procedure(s) Performed: Posterior Lumbar Interbody Fusion - Lumbar Four-Lumbar Five - Lumbar Five-Sacral One (N/A Back)  Patient Location: PACU  Anesthesia Type:General  Level of Consciousness: drowsy and patient cooperative  Airway & Oxygen Therapy: Patient Spontanous Breathing and Patient connected to nasal cannula oxygen  Post-op Assessment: Report given to RN, Post -op Vital signs reviewed and stable and Patient moving all extremities X 4  Post vital signs: Reviewed and stable  Last Vitals:  Vitals Value Taken Time  BP 115/72 03/24/19 1152  Temp    Pulse 85 03/24/19 1158  Resp 21 03/24/19 1158  SpO2 100 % 03/24/19 1158  Vitals shown include unvalidated device data.  Last Pain:  Vitals:   03/24/19 1152  TempSrc:   PainSc: (P) Asleep         Complications: No apparent anesthesia complications

## 2019-03-24 NOTE — Evaluation (Signed)
Physical Therapy Evaluation Patient Details Name: Linda Roberts MRN: [id number] DOB: Nov 19, 1980 Today's Date: 03/24/2019   History of Present Illness  38 yo female s/p PLIF L4-L5, L5-S1 on 03/24/19. PMH includes previous disc herniation x2 with lumbar laminectomy/decompression/microdiscectomy x2 2019 and 2020, HTN, anxiety.  Clinical Impression   Pt presents with mild back pain, decreased knowledge of spinal precautions, difficulty with bed mobility, increased time and effort to mobilize, and decreased activity tolerance. Pt to benefit from acute PT to address deficits. Pt ambulated good hallway distance with no AD with occasional unsteadiness, corrected by pt. Pt able to don/doff brace with min assist from PT, but states she has been using it for several days at home. Pt lives with her husband and children, who can assist her upon d/c as needed. PT to progress mobility as tolerated, and will continue to follow acutely.      Follow Up Recommendations Follow surgeon's recommendation for DC plan and follow-up therapies;Supervision for mobility/OOB    Equipment Recommendations  None recommended by PT    Recommendations for Other Services       Precautions / Restrictions Precautions Precautions: Fall;Back Precaution Booklet Issued: Yes (comment) Precaution Comments: handout administered and reviewed - verbal cuing for no bending, lifting, twisting, arching spine, and log roll technique in and out of bed Required Braces or Orthoses: Spinal Brace Spinal Brace: Lumbar corset;Applied in sitting position Restrictions Weight Bearing Restrictions: No      Mobility  Bed Mobility Overal bed mobility: Needs Assistance Bed Mobility: Rolling;Sidelying to Sit;Sit to Sidelying Rolling: Min assist Sidelying to sit: Min assist     Sit to sidelying: Min assist General bed mobility comments: min assist for log roll technique for trunk and LE management, pt able to scoot to and from  EOB.  Transfers Overall transfer level: Needs assistance Equipment used: None Transfers: Sit to/from Stand Sit to Stand: Min guard         General transfer comment: min guard for safety, verbal cuing for bending at hips and knees to come to standing.  Ambulation/Gait Ambulation/Gait assistance: Min guard Gait Distance (Feet): 280 Feet Assistive device: 1 person hand held assist Gait Pattern/deviations: Step-through pattern;Decreased stride length;Wide base of support Gait velocity: decr   General Gait Details: Min guard for safety, verbal cuing for upright posture. Pt with intermittent unsteadiness, self-corrected.  Stairs            Wheelchair Mobility    Modified Rankin (Stroke Patients Only)       Balance Overall balance assessment: Mild deficits observed, not formally tested                                           Pertinent Vitals/Pain Pain Assessment: 0-10 Pain Score: 3  Pain Location: back Pain Descriptors / Indicators: Discomfort;Grimacing Pain Intervention(s): Limited activity within patient's tolerance;Monitored during session;Premedicated before session;Repositioned    Home Living Family/patient expects to be discharged to:: Private residence Living Arrangements: Spouse/significant other Available Help at Discharge: Family Type of Home: House Home Access: Level entry   Entrance Stairs-Number of Steps: level from [place]: Multi-level Home Equipment: Environmental consultant - 2 wheels      Prior Function Level of Independence: Independent         Comments: has a RW that she could borrow from her mother, but pt completely independent with mobility PTA.  Hand Dominance   Dominant Hand: Right    Extremity/Trunk Assessment   Upper Extremity Assessment Upper Extremity Assessment: Defer to OT evaluation    Lower Extremity Assessment Lower Extremity Assessment: Overall WFL for tasks assessed    Cervical / Trunk  Assessment Cervical / Trunk Assessment: Normal  Communication   Communication: No difficulties  Cognition Arousal/Alertness: Awake/alert Behavior During Therapy: WFL for tasks assessed/performed Overall Cognitive Status: Within Functional Limits for tasks assessed                                        General Comments General comments (skin integrity, edema, etc.): pt reports hearing "pop" when rolling; PT checked dressing and drain, no abnormalities noted. RN notified.    Exercises     Assessment/Plan    PT Assessment Patient needs continued PT services  PT Problem List Decreased mobility;Decreased safety awareness;Decreased knowledge of precautions;Decreased activity tolerance;Decreased balance;Pain       PT Treatment Interventions DME instruction;Therapeutic activities;Gait training;Therapeutic exercise;Patient/family education;Balance training;Stair training;Functional mobility training    PT Goals (Current goals can be found in the Care Plan section)  Acute Rehab PT Goals Patient Stated Goal: be independent PT Goal Formulation: With patient Time For Goal Achievement: 03/31/19 Potential to Achieve Goals: Good    Frequency Min 5X/week   Barriers to discharge        Co-evaluation               AM-PAC PT "6 Clicks" Mobility  Outcome Measure Help needed turning from your back to your side while in a flat bed without using bedrails?: A Little Help needed moving from lying on your back to sitting on the side of a flat bed without using bedrails?: A Little Help needed moving to and from a bed to a chair (including a wheelchair)?: A Little Help needed standing up from a chair using your arms (e.g., wheelchair or bedside chair)?: A Little Help needed to walk in hospital room?: A Little Help needed climbing 3-5 steps with a railing? : A Lot 6 Click Score: 17    End of Session Equipment Utilized During Treatment: Gait belt Activity Tolerance:  Patient tolerated treatment well;Other (comment)(limited by mild nausea) Patient left: in bed;with call bell/phone within reach;with family/visitor present;with SCD's reapplied Nurse Communication: Mobility status PT Visit Diagnosis: Other abnormalities of gait and mobility (R26.89);Difficulty in walking, not elsewhere classified (R26.2);Pain Pain - part of body: (back)    Time: 1740-8144 PT Time Calculation (min) (ACUTE ONLY): 20 min   Charges:   PT Evaluation $PT Eval Low Complexity: 1 Low          Jeran Hiltz E, PT Acute Rehabilitation Services Pager [phone]  Office [phone]   Oday Ridings D Talicia Sui 03/24/2019, 5:59 PM

## 2019-03-24 NOTE — H&P (Signed)
Linda Roberts is an 38 y.o. female.   Chief Complaint: Back and leg pain. HPI: 38 year old female whose had longstanding issues with her back she has had 2 previous disc herniations 2 previous laminotomies for disc herniation at L5-S1 as well as severe spondylosis central disc bulge and a slight listhesis at L4-5.  Patient presented with progressive worsening back and leg pain work-up revealed a second recurrence or third herniation at L5-S1 with progression at L4-5.  Due to patient's progression of clinical syndrome imaging findings and failed conservative treatment I recommended decompressive laminectomy and discectomy at L5-S1 decompressive laminectomy at L4-5 with interbody fusions at both levels and cortical screws.  I have extensively gone over the risks and benefits of the procedure with her as well as perioperative course expectations of outcome and alternatives of surgery and she understands and agrees to proceed forward.  Past Medical History:  Diagnosis Date  . Anemia   . Anxiety   . Hypertension    as a 38 year old - none after losing weight  . Ruptured lumbar disc     Past Surgical History:  Procedure Laterality Date  . LEEP    . LUMBAR LAMINECTOMY/DECOMPRESSION MICRODISCECTOMY Right 06/11/2017   Procedure: Microdiscectomy - right - L5-S1;  Surgeon: Kary Kos, MD;  Location: [facility];  Service: Neurosurgery;  Laterality: Right;  . LUMBAR LAMINECTOMY/DECOMPRESSION MICRODISCECTOMY Right 08/26/2018   Procedure: Microdiscectomy - right - L5-S1 redo;  Surgeon: Kary Kos, MD;  Location: [facility];  Service: Neurosurgery;  Laterality: Right;  Microdiscectomy - right - L5-S1 redo    Family History  Problem Relation Age of Onset  . Arthritis/Rheumatoid Mother   . Dementia Father    Social History:  reports that she has never smoked. She has never used smokeless tobacco. She reports that she does not drink alcohol or use drugs.  Allergies: No Known Allergies  Medications Prior to Admission   Medication Sig Dispense Refill  . ELDERBERRY PO Take 2 tablets by mouth daily at 12 noon. Gummie    . meloxicam (MOBIC) 15 MG tablet Take 1 tablet (15 mg total) by mouth daily. (Patient taking differently: Take 7.5 mg by mouth 2 (two) times daily. ) 30 tablet 1  . traMADol (ULTRAM) 50 MG tablet Take 50 mg by mouth 2 (two) times daily.     . cyclobenzaprine (FLEXERIL) 10 MG tablet Take 1 tablet (10 mg total) by mouth 3 (three) times daily as needed for muscle spasms. (Patient not taking: Reported on 03/12/2019) 30 tablet 0  . HYDROcodone-acetaminophen (NORCO/VICODIN) 5-325 MG tablet Take 1 tablet by mouth every 4 (four) hours as needed for moderate pain. (Patient not taking: Reported on 03/12/2019) 30 tablet 0  . levonorgestrel (MIRENA) 20 MCG/24HR IUD 1 each by Intrauterine route once.    . ondansetron (ZOFRAN-ODT) 4 MG disintegrating tablet Take 4 mg by mouth every 8 (eight) hours as needed for nausea or vomiting.      Results for orders placed or performed during the hospital encounter of 03/24/19 (from the past 48 hour(s))  Pregnancy, urine POC     Status: None   Collection Time: 03/24/19  6:15 AM  Result Value Ref Range   Preg Test, Ur NEGATIVE NEGATIVE    Comment:        THE SENSITIVITY OF THIS METHODOLOGY IS >24 mIU/mL    No results found.  Review of Systems  Musculoskeletal: Positive for back pain.  Neurological: Positive for numbness.    Blood pressure 125/82,  pulse 81, temperature 98.4 F (36.9 C), temperature source Oral, resp. rate 20, height 5' 9.75" (1.772 m), last menstrual period 03/20/2019, SpO2 99 %. Physical Exam  Constitutional: She is oriented to person, place, and time. She appears well-developed.  HENT:  Head: Normocephalic.  Eyes: Pupils are equal, round, and reactive to light.  Cardiovascular: Normal rate and regular rhythm.  Respiratory: Effort normal.  GI: Soft. Bowel sounds are normal.  Musculoskeletal:        General: Normal range of motion.      Cervical back: Normal range of motion.  Neurological: She is alert and oriented to person, place, and time. She has normal strength. GCS eye subscore is 4. GCS verbal subscore is 5. GCS motor subscore is 6.  Strength is 5/5 iliopsoas, quads, hamstrings, gastroc, and tibialis, and EHL.     Assessment/Plan 38 year old female presents for laminectomy interbody fusions L4-5 L5-S1.  Aayat Hajjar P, MD 03/24/2019, 7:19 AM

## 2019-03-24 NOTE — Anesthesia Procedure Notes (Signed)
Procedure Name: Intubation Date/Time: 03/24/2019 7:38 AM Performed by: Colin Benton, CRNA Pre-anesthesia Checklist: Patient identified, Emergency Drugs available, Suction available and Patient being monitored Patient Re-evaluated:Patient Re-evaluated prior to induction Oxygen Delivery Method: Circle system utilized Preoxygenation: Pre-oxygenation with 100% oxygen Induction Type: IV induction Ventilation: Mask ventilation without difficulty Laryngoscope Size: Miller and 2 Grade View: Grade I Tube type: Oral Tube size: 7.0 mm Number of attempts: 1 Airway Equipment and Method: Stylet Placement Confirmation: ETT inserted through vocal cords under direct vision,  positive ETCO2 and breath sounds checked- equal and bilateral Secured at: 22 cm Tube secured with: Tape Dental Injury: Teeth and Oropharynx as per pre-operative assessment

## 2019-03-25 MED ORDER — METHOCARBAMOL 500 MG PO TABS: 500.0000 mg | ORAL_TABLET | Freq: Four times a day (QID) | ORAL | 1 refills | Status: AC | PRN

## 2019-03-25 NOTE — Progress Notes (Signed)
Physical Therapy Treatment Patient Details Name: Linda Roberts MRN: [id number] DOB: Dec 15, 1980 Today's Date: 03/25/2019    History of Present Illness 38 yo female s/p PLIF L4-L5, L5-S1 on 03/24/19. PMH includes previous disc herniation x2 with lumbar laminectomy/decompression/microdiscectomy x2 2019 and 2020, HTN, anxiety.    PT Comments    Pt progressing well with post-op mobility. She was able to demonstrate transfers and ambulation with gross modified independence and no AD, and stair training with light supervision for safety. Pt was educated on precautions, brace application/wearing schedule, appropriate activity progression, and car transfer. As pt has met all acute PT goals, will sign off at this time. If needs change, please reconsult.       Follow Up Recommendations  No PT follow up;Supervision - Intermittent     Equipment Recommendations  None recommended by PT    Recommendations for Other Services       Precautions / Restrictions Precautions Precautions: Fall;Back Precaution Booklet Issued: Yes (comment) Precaution Comments: handout administered and reviewed - verbal cuing for no bending, lifting, twisting, arching spine, and log roll technique in and out of bed Required Braces or Orthoses: Spinal Brace Spinal Brace: Lumbar corset;Applied in sitting position Restrictions Weight Bearing Restrictions: No    Mobility  Bed Mobility Overal bed mobility: Modified Independent Bed Mobility: Rolling;Sidelying to Sit;Sit to Sidelying           General bed mobility comments: HOB slightly elevated to simulate home environment. Pt was able to perform good log roll with no cues needed for technique.   Transfers Overall transfer level: Modified independent Equipment used: None Transfers: Sit to/from Stand           General transfer comment: Pt demonstrated good posture and proper hand placement on seated surface for safety. No assist or unsteadiness noted with  sit<>stand.   Ambulation/Gait Ambulation/Gait assistance: Modified independent (Device/Increase time) Gait Distance (Feet): 300 Feet Assistive device: None Gait Pattern/deviations: Step-through pattern;Decreased stride length Gait velocity: Decreased Gait velocity interpretation: 1.31 - 2.62 ft/sec, indicative of limited community ambulator General Gait Details: Slightly guarded but overall ambulating well in the hallway without UE support. Casual pace with relaxed arm swing noted.    Stairs Stairs: Yes Stairs assistance: Supervision Stair Management: One rail Right;Alternating pattern;Forwards Number of Stairs: 10 General stair comments: VC's for sequencing and general safety. No assist required.    Wheelchair Mobility    Modified Rankin (Stroke Patients Only)       Balance Overall balance assessment: Mild deficits observed, not formally tested                                          Cognition Arousal/Alertness: Awake/alert Behavior During Therapy: WFL for tasks assessed/performed Overall Cognitive Status: Within Functional Limits for tasks assessed                                        Exercises      General Comments        Pertinent Vitals/Pain Pain Assessment: Faces Faces Pain Scale: Hurts a little bit Pain Location: Incision site Pain Descriptors / Indicators: Operative site guarding Pain Intervention(s): Limited activity within patient's tolerance;Monitored during session;Repositioned    Home Living Family/patient expects to be discharged to:: Private residence Living Arrangements: Spouse/significant other Available Help at  Discharge: Family Type of Home: House Home Access: Level entry   Home Layout: Multi-level Home Equipment: Walker - 2 wheels      Prior Function Level of Independence: Independent      Comments: has a RW that she could borrow from her mother, but pt completely independent with mobility PTA.    PT Goals (current goals can now be found in the care plan section) Acute Rehab PT Goals Patient Stated Goal: be independent PT Goal Formulation: With patient Time For Goal Achievement: 03/31/19 Potential to Achieve Goals: Good Progress towards PT goals: Goals met/education completed, patient discharged from PT    Frequency    Min 5X/week      PT Plan Current plan remains appropriate    Co-evaluation              AM-PAC PT "6 Clicks" Mobility   Outcome Measure  Help needed turning from your back to your side while in a flat bed without using bedrails?: None Help needed moving from lying on your back to sitting on the side of a flat bed without using bedrails?: None Help needed moving to and from a bed to a chair (including a wheelchair)?: None Help needed standing up from a chair using your arms (e.g., wheelchair or bedside chair)?: None Help needed to walk in hospital room?: None Help needed climbing 3-5 steps with a railing? : A Little 6 Click Score: 23    End of Session Equipment Utilized During Treatment: Gait belt Activity Tolerance: Patient tolerated treatment well Patient left: in bed;with call bell/phone within reach Nurse Communication: Mobility status PT Visit Diagnosis: Other abnormalities of gait and mobility (R26.89);Difficulty in walking, not elsewhere classified (R26.2);Pain Pain - part of body: (back)     Time: 1245-8099 PT Time Calculation (min) (ACUTE ONLY): 18 min  Charges:  $Gait Training: 8-22 mins                     Linda Roberts, PT, DPT Acute Rehabilitation Services Pager: [phone] Office: [phone]    Thelma Comp 03/25/2019, 9:00 AM

## 2019-03-25 NOTE — Discharge Instructions (Signed)

## 2019-03-25 NOTE — Plan of Care (Signed)
Patient alert and oriented, mae's well, voiding adequate amount of urine, swallowing without difficulty, no c/o pain at time of discharge. Patient discharged home with family. Script and discharged instructions given to patient. Patient and family stated understanding of instructions given. Patient has an appointment with Dr. Cram in 2 weeks 

## 2019-03-25 NOTE — Discharge Summary (Signed)
Physician Discharge Summary  Patient ID: Linda Roberts MRN: [id number] DOB/AGE: 1980/10/19 38 y.o. Estimated body mass index is 24.02 kg/m as calculated from the following:   Height as of this encounter: 5' 9.75" (1.772 m).   Weight as of 03/20/19: 75.4 kg.   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  Postoperative patient did very well and covering the floor on the floor was ambulating and voiding spontaneously tolerating regular diet stable for discharge home.  Consults: Significant Diagnostic Studies: Treatments: L4-5 L5-S1 decompression fusion Discharge Exam: Blood pressure 95/60, pulse 73, temperature (!) 97.5 F (36.4 C), temperature source Oral, resp. rate 18, height 5' 9.75" (1.772 m), last menstrual period 03/20/2019, SpO2 98 %. Strength 5 out of 5 wound clean dry and intact  Disposition: Home   Allergies as of 03/25/2019   No Known Allergies     Medication List    STOP taking these medications   cyclobenzaprine 10 MG tablet Commonly known as: FLEXERIL   HYDROcodone-acetaminophen 5-325 MG tablet Commonly known as: NORCO/VICODIN     TAKE these medications   ELDERBERRY PO Take 2 tablets by mouth daily at 12 noon. Gummie   levonorgestrel 20 MCG/24HR IUD Commonly known as: MIRENA 1 each by Intrauterine route once.   meloxicam 15 MG tablet Commonly known as: MOBIC Take 1 tablet (15 mg total) by mouth daily. What changed:   how much to take  when to take this   methocarbamol 500 MG tablet Commonly known as: ROBAXIN Take 1 tablet (500 mg total) by mouth every 6 (six) hours as needed for muscle spasms  (IF FLEXERIL INEFFECTIVE).   ondansetron 4 MG disintegrating tablet Commonly known as: ZOFRAN-ODT Take 4 mg by mouth every 8 (eight) hours as needed for nausea or vomiting.   traMADol 50 MG tablet Commonly known as: ULTRAM Take 50 mg by mouth 2 (two) times daily.        Signed: Tacy Chavis P 03/25/2019, 10:29 AM

## 2019-03-25 NOTE — Evaluation (Signed)
Occupational Therapy Evaluation Patient Details Name: Linda Roberts MRN: [id number] DOB: [birth year] Today's Date: 03/25/2019    History of Present Illness 38 yo female s/p PLIF L4-L5, L5-S1 on 03/24/19. PMH includes previous disc herniation x2 with lumbar laminectomy/decompression/microdiscectomy x2 2019 and 2020, HTN, anxiety.   Clinical Impression   PTA patient independent. Admitted for above and limited by problem list below, including pain, precaution adherence.  Patient educated on precautions, brace mgmt and wear schedule, ADL compensatory techniques, recommendations, DME and safety. Patient demonstrating ability to complete ADLs and transfers with modified independence after education of compensatory techniques, good recall and adherence to precautions with minimal cueing. Based on performance today, no further OT needs have been identified.  Thank you for this referral. OT will sign off.     Follow Up Recommendations  No OT follow up;Supervision - Intermittent    Equipment Recommendations  None recommended by OT    Recommendations for Other Services       Precautions / Restrictions Precautions Precautions: Fall;Back Precaution Booklet Issued: Yes (comment) Precaution Comments: min cueing to adhere functionally Required Braces or Orthoses: Spinal Brace Spinal Brace: Lumbar corset;Applied in sitting position Restrictions Weight Bearing Restrictions: No      Mobility Bed Mobility Overal bed mobility: Modified Independent             General bed mobility comments: able to complete log roll technique without cueing   Transfers Overall transfer level: Modified independent                    Balance Overall balance assessment: Mild deficits observed, not formally tested                                         ADL either performed or assessed with clinical judgement   ADL Overall ADL's : Modified independent                                        General ADL Comments: patient able to demonstrate completion of ADLs, transfers with modified independence after education of back precautions, compensatory techniques and safety     Vision         Perception     Praxis      Pertinent Vitals/Pain Pain Assessment: Faces Faces Pain Scale: Hurts a little bit Pain Location: Incision site Pain Descriptors / Indicators: Operative site guarding Pain Intervention(s): Monitored during session;Repositioned     Hand Dominance Right   Extremity/Trunk Assessment Upper Extremity Assessment Upper Extremity Assessment: Overall WFL for tasks assessed   Lower Extremity Assessment Lower Extremity Assessment: Defer to PT evaluation   Cervical / Trunk Assessment Cervical / Trunk Assessment: Other exceptions Cervical / Trunk Exceptions: s/p back surgery   Communication Communication Communication: No difficulties   Cognition Arousal/Alertness: Awake/alert Behavior During Therapy: WFL for tasks assessed/performed Overall Cognitive Status: Within Functional Limits for tasks assessed                                     General Comments  spouse present and supportive    Exercises     Shoulder Instructions      Home Living Family/patient expects to be discharged to:: Private residence Living Arrangements:  Spouse/significant other Available Help at Discharge: Family Type of Home: House Home Access: Level entry Entrance Stairs-Number of Steps: level from [place]: Multi-level Alternate Level Stairs-Number of Steps: split level - 4 steps to bedroom from main floor Alternate Level Stairs-Rails: None Bathroom Shower/Tub: Occupational psychologist: Standard     Home Equipment: Environmental consultant - 2 wheels;Shower seat          Prior Functioning/Environment Level of Independence: Independent        Comments: independent, has [place] she can borrow from her mom        OT  Problem List: Decreased activity tolerance;Decreased knowledge of use of DME or AE;Decreased knowledge of precautions;Pain      OT Treatment/Interventions:      OT Goals(Current goals can be found in the care plan section) Acute Rehab OT Goals Patient Stated Goal: home OT Goal Formulation: With patient  OT Frequency:     Barriers to D/C:            Co-evaluation              AM-PAC OT "6 Clicks" Daily Activity     Outcome Measure Help from another person eating meals?: None Help from another person taking care of personal grooming?: None Help from another person toileting, which includes using toliet, bedpan, or urinal?: None Help from another person bathing (including washing, rinsing, drying)?: None Help from another person to put on and taking off regular upper body clothing?: None Help from another person to put on and taking off regular lower body clothing?: None 6 Click Score: 24   End of Session Equipment Utilized During Treatment: Back brace Nurse Communication: Mobility status  Activity Tolerance: Patient tolerated treatment well Patient left: with call bell/phone within reach;Other (comment)(seated EOB)  OT Visit Diagnosis: Pain;Other abnormalities of gait and mobility (R26.89) Pain - part of body: (back-incisonal)                Time: 9924-2683 OT Time Calculation (min): 12 min Charges:  OT General Charges $OT Visit: 1 Visit OT Evaluation $OT Eval Low Complexity: 1 Low  [name], OT Acute Rehabilitation Services Pager [phone] Office [phone]   [name] 03/25/2019, 2:28 PM

## 2019-03-26 ENCOUNTER — Encounter: Payer: Self-pay | Admitting: *Deleted

## 2019-03-27 MED FILL — Sodium Chloride IV Soln 0.9%: INTRAVENOUS | Qty: 1000 | Status: AC

## 2019-03-27 MED FILL — Heparin Sodium (Porcine) Inj 1000 Unit/ML: INTRAMUSCULAR | Qty: 30 | Status: AC

## 2020-04-07 ENCOUNTER — Other Ambulatory Visit: Payer: Self-pay | Admitting: Neurosurgery

## 2020-04-07 DIAGNOSIS — M5440 Lumbago with sciatica, unspecified side: Secondary | ICD-10-CM

## 2020-07-08 ENCOUNTER — Other Ambulatory Visit: Payer: PRIVATE HEALTH INSURANCE

## 2020-07-19 ENCOUNTER — Other Ambulatory Visit: Payer: Self-pay | Admitting: Neurosurgery

## 2020-07-19 DIAGNOSIS — M5440 Lumbago with sciatica, unspecified side: Secondary | ICD-10-CM

## 2020-07-24 ENCOUNTER — Ambulatory Visit
Admission: RE | Admit: 2020-07-24 | Discharge: 2020-07-24 | Disposition: A | Discharge status: Home / Self Care | Payer: PRIVATE HEALTH INSURANCE | Source: Ambulatory Visit | Attending: Neurosurgery | Admitting: Neurosurgery

## 2020-07-24 ENCOUNTER — Other Ambulatory Visit: Payer: Self-pay

## 2020-07-24 DIAGNOSIS — M5440 Lumbago with sciatica, unspecified side: Secondary | ICD-10-CM

## 2021-07-29 ENCOUNTER — Ambulatory Visit (INDEPENDENT_AMBULATORY_CARE_PROVIDER_SITE_OTHER): Payer: Medicare Other

## 2021-07-29 ENCOUNTER — Ambulatory Visit (INDEPENDENT_AMBULATORY_CARE_PROVIDER_SITE_OTHER): Payer: Medicare Other | Admitting: Orthopaedic Surgery

## 2021-07-29 ENCOUNTER — Encounter: Payer: Self-pay | Admitting: Orthopaedic Surgery

## 2021-07-29 DIAGNOSIS — M25552 Pain in left hip: Secondary | ICD-10-CM | POA: Insufficient documentation

## 2021-07-29 DIAGNOSIS — M545 Low back pain, unspecified: Secondary | ICD-10-CM

## 2021-07-29 NOTE — Addendum Note (Signed)
Addended by: Lendon Collar on: 07/29/2021 11:00 AM ? ? Modules accepted: Orders ? ?

## 2021-07-29 NOTE — Progress Notes (Signed)
? ?Office Visit Note ?  ?Patient: Linda Roberts           ?Date of Birth: [birth year]           ?MRN: [id number] ?Visit Date: 07/29/2021 ?             ?Requested by: No referring provider defined for this encounter. ?PCP: Patient, No Pcp Per (Inactive) ? ? ?Assessment & Plan: ?Visit Diagnoses:  ?1. Pain in left hip   ?2. Left hip pain   ? ? ?Plan: Impression is left hip pain.  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  I suspect she may have a labral tear.  Based on findings I have suggested intra-articular cortisone injection and relative rest for 6 weeks.  Linda Roberts will contact me if her symptoms persist at which point we would obtain an MR arthrogram for further evaluation. ? ?Follow-Up Instructions: No follow-ups on file.  ? ?Orders:  ?Orders Placed This Encounter  ?Procedures  ? XR HIP UNILAT W OR W/O PELVIS 2-3 VIEWS LEFT  ? XR Lumbar Spine 2-3 Views  ? ?No orders of the defined types were placed in this encounter. ? ? ? ? Procedures: ?No procedures performed ? ? ?Clinical Data: ?No additional findings. ? ? ?Subjective: ?Chief Complaint  ?Patient presents with  ? Left Hip - Pain  ? Lower Back - Pain  ? ? ?HPI ? ?Linda Roberts is a very pleasant 41 year old female here for chronic left hip pain since December.  She is currently on disability status post L4 S1 fusion.  She has had 3 back surgeries by Dr. Wynetta Emery.  She feels pain in the left hip and groin region that is worse with activity.  There is no direct tenderness to palpation.  Denies any numbness and tingling. ? ?Review of Systems  ?Constitutional: Negative.   ?HENT: Negative.    ?Eyes: Negative.   ?Respiratory: Negative.    ?Cardiovascular: Negative.   ?Endocrine: Negative.   ?Musculoskeletal: Negative.   ?Neurological: Negative.   ?Hematological: Negative.   ?Psychiatric/Behavioral: Negative.    ?All other systems reviewed and are negative. ? ? ?Objective: ?Vital Signs: There were no vitals taken for this visit. ? ?Physical Exam ?Vitals and nursing note reviewed.  ?Constitutional:    ?   Appearance: She is well-developed.  ?HENT:  ?   Head: Normocephalic and atraumatic.  ?Pulmonary:  ?   Effort: Pulmonary effort is normal.  ?Abdominal:  ?   Palpations: Abdomen is soft.  ?Musculoskeletal:  ?   Cervical back: Neck supple.  ?Skin: ?   General: Skin is warm.  ?   Capillary Refill: Capillary refill takes less than 2 seconds.  ?Neurological:  ?   Mental Status: She is alert and oriented to person, place, and time.  ?Psychiatric:     ?   Behavior: Behavior normal.     ?   Thought Content: Thought content normal.     ?   Judgment: Judgment normal.  ? ? ?Ortho Exam ? ?Examination of the left hip shows well-preserved range of motion.  Pain with deep flexion of the hip and with FADIR and Stinchfield sign.  No tenderness to palpation.  Negative logroll.  No pain with circumduction.  No sciatic tension signs. ? ?Specialty Comments:  ?No specialty comments available. ? ?Imaging: ?XR HIP UNILAT W OR W/O PELVIS 2-3 VIEWS LEFT ? ?Result Date: 07/29/2021 ?CAM deformity of left femoral head ? ?XR Lumbar Spine 2-3 Views ? ?Result Date: 07/29/2021 ?Status post L4-S1 fusion.    ? ? ?  PMFS History: ?Patient Active Problem List  ? Diagnosis Date Noted  ? Pain in left hip 07/29/2021  ? HNP (herniated nucleus pulposus), lumbar 06/11/2017  ? ?Past Medical History:  ?Diagnosis Date  ? Anemia   ? Anxiety   ? Hypertension   ? as a 41 year old - none after losing weight  ? Ruptured lumbar disc   ?  ?Family History  ?Problem Relation Age of Onset  ? Arthritis/Rheumatoid Mother   ? Dementia Father   ?  ?Past Surgical History:  ?Procedure Laterality Date  ? LEEP    ? LUMBAR LAMINECTOMY/DECOMPRESSION MICRODISCECTOMY Right 06/11/2017  ? Procedure: Microdiscectomy - right - L5-S1;  Surgeon: Donalee Citrin, MD;  Location: [facility] OR;  Service: Neurosurgery;  Laterality: Right;  ? LUMBAR LAMINECTOMY/DECOMPRESSION MICRODISCECTOMY Right 08/26/2018  ? Procedure: Microdiscectomy - right - L5-S1 redo;  Surgeon: Donalee Citrin, MD;  Location: [facility] OR;   Service: Neurosurgery;  Laterality: Right;  Microdiscectomy - right - L5-S1 redo  ? ?Social History  ? ?Occupational History  ? Not on file  ?Tobacco Use  ? Smoking status: Never  ? Smokeless tobacco: Never  ?Vaping Use  ? Vaping Use: Never used  ?Substance and Sexual Activity  ? Alcohol use: No  ? Drug use: No  ? Sexual activity: Yes  ? ? ? ? ? ? ?

## 2021-08-23 ENCOUNTER — Ambulatory Visit (INDEPENDENT_AMBULATORY_CARE_PROVIDER_SITE_OTHER): Payer: Medicare Other | Admitting: Physical Medicine and Rehabilitation

## 2021-08-23 ENCOUNTER — Ambulatory Visit: Payer: Self-pay

## 2021-08-23 ENCOUNTER — Encounter: Payer: Self-pay | Admitting: Physical Medicine and Rehabilitation

## 2021-08-23 DIAGNOSIS — M25552 Pain in left hip: Secondary | ICD-10-CM | POA: Diagnosis not present

## 2021-08-23 NOTE — Progress Notes (Unsigned)
   Linda Roberts - 42 y.o. female MRN [id number]  Date of birth: Dec 28, 1980  Office Visit Note: Visit Date: 08/23/2021 PCP: Patient, No Pcp Per (Inactive) Referred by: Leandrew Koyanagi, MD  Subjective: No chief complaint on file.  HPI:  Linda Roberts is a 41 y.o. female who comes in todayHPI ROS Otherwise per HPI.  Assessment & Plan: Visit Diagnoses: No diagnosis found.  Plan: No additional findings.   Meds & Orders: No orders of the defined types were placed in this encounter.  No orders of the defined types were placed in this encounter.   Follow-up: No follow-ups on file.   Procedures: Large Joint Inj: L hip joint on 08/23/2021 10:49 AM Indications: diagnostic evaluation and pain Details: 22 G 3.5 in needle, fluoroscopy-guided anterior approach  Arthrogram: No  Medications: 4 mL bupivacaine 0.25 %; 60 mg triamcinolone acetonide 40 MG/ML Outcome: tolerated well, no immediate complications  There was excellent flow of contrast producing a partial arthrogram of the hip. The patient did have relief of symptoms during the anesthetic phase of the injection. Procedure, treatment alternatives, risks and benefits explained, specific risks discussed. Consent was given by the patient. Immediately prior to procedure a time out was called to verify the correct patient, procedure, equipment, support staff and site/side marked as required. Patient was prepped and draped in the usual sterile fashion.         Clinical History: No specialty comments available.     Objective:  VS:  HT:    WT:   BMI:     BP:   HR: bpm  TEMP: ( )  RESP:  Physical Exam   Imaging: No results found.

## 2021-08-23 NOTE — Progress Notes (Unsigned)
Pt state left hip pian. Pt state exciting makes the pain worse. Pt state she takes over the counter pain meds to help ease his pain.  Numeric Pain Rating Scale and Functional Assessment Average Pain 8   In the last MONTH (on 0-10 scale) has pain interfered with the following?  1. General activity like being  able to carry out your everyday physical activities such as walking, climbing stairs, carrying groceries, or moving a chair?  Rating(10)    -BT, -Dye Allergies.

## 2021-08-24 MED ORDER — BUPIVACAINE HCL 0.25 % IJ SOLN
4.0000 mL | INTRAMUSCULAR | Status: AC | PRN
  Administered 2021-08-23: 4 mL via INTRA_ARTICULAR

## 2021-08-24 MED ORDER — TRIAMCINOLONE ACETONIDE 40 MG/ML IJ SUSP
60.0000 mg | INTRAMUSCULAR | Status: AC | PRN
  Administered 2021-08-23: 60 mg via INTRA_ARTICULAR

## 2022-01-31 ENCOUNTER — Ambulatory Visit: Payer: Medicare Other | Admitting: Rehabilitative and Restorative Service Providers"

## 2022-01-31 NOTE — Therapy (Signed)
OUTPATIENT PHYSICAL THERAPY CERVICAL/LUMBAR EVALUATION   Patient Name: Linda Roberts MRN: [id number] DOB:1980-12-28, 41 y.o., female Today's Date: 02/01/2022   PT End of Session - 02/01/22 1101     Visit Number 1    Number of Visits 17    Date for PT Re-Evaluation 03/29/22    Authorization Type Medicare AB    Authorization Time Period FOTO v6 and v10, kx mod v15    Progress Note Due on Visit 10    PT Start Time 1103    PT Stop Time 1157    PT Time Calculation (min) 54 min    Activity Tolerance Patient tolerated treatment well;Patient limited by pain    Behavior During Therapy WFL for tasks assessed/performed             Past Medical History:  Diagnosis Date   Anemia    Anxiety    Hypertension    as a 41 year old - none after losing weight   Ruptured lumbar disc    Past Surgical History:  Procedure Laterality Date   LEEP     LUMBAR LAMINECTOMY/DECOMPRESSION MICRODISCECTOMY Right 06/11/2017   Procedure: Microdiscectomy - right - L5-S1;  Surgeon: Kary Kos, MD;  Location: [facility];  Service: Neurosurgery;  Laterality: Right;   LUMBAR LAMINECTOMY/DECOMPRESSION MICRODISCECTOMY Right 08/26/2018   Procedure: Microdiscectomy - right - L5-S1 redo;  Surgeon: Kary Kos, MD;  Location: [place] [facility];  Service: Neurosurgery;  Laterality: Right;  Microdiscectomy - right - L5-S1 redo   Patient Active Problem List   Diagnosis Date Noted   Pain in left hip 07/29/2021   HNP (herniated nucleus pulposus), lumbar 06/11/2017    PCP: none in chart  REFERRING PROVIDER: Eleonore Chiquito, NP  REFERRING DIAG: M54.12 (ICD-10-CM) - Radiculopathy, cervical region M54.40 (ICD-10-CM) - Lumbago with sciatica, unspecified side  THERAPY DIAG:  Cervicalgia  Muscle weakness (generalized)  Abnormal posture  Rationale for Evaluation and Treatment: Rehabilitation  ONSET DATE: Mid September, gradually worsening, no provoking incident  SUBJECTIVE:                                                                                                                                                                                                          SUBJECTIVE STATEMENT: Pt states she has had ongoing back issues for years, three prior surgeries, most recent being lumbar fusion in 2021. Pt states over past year her back has begun to bother her more again. Pt states in mid September she began to have issues with L side of neck, shoulder, and UE; more  painful initially, now has brief intense pain but mostly numbness/weakness in LUE. Pt typically fairly active, enjoys yoga, walking, and doing gym activity, but has since reduced activity levels due to pain. 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 Pt mentions she had prior L hip pain but received cortisone shot with good relief. Has also had to modify sleeping position due to pain.   PERTINENT HISTORY:  Spinal fusion lumbar spine 2021, prior laminectomies and decompression  PAIN:  Are you having pain: yes, 4-5/10 back Location: neck - L side (along UT, shoulder blade, shoulder), refers into medial UE down to finger; back centered around tailbone How would you describe your pain? Back - stiff, neck shooting pain Best in past week: 0/10 neck (still has numbness/weakness), 3/10 back Worst in past week: 7/10 back, 10/10 neck Aggravating factors: laying on back (neck), sitting or standing too long (back), being in car (both), upper body dressing and washing hair Easing factors: positioning, ibuprofen, moving, TENS, ice/heat  PRECAUTIONS: None  WEIGHT BEARING RESTRICTIONS: No  FALLS:  Has patient fallen in last 6 months? No  LIVING ENVIRONMENT: Lives with spouse and 43 year old daughter, 52 year old daughter intermittently; 2 story home, bedroom downstairs, pt tries to avoid  stair navigation. 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  PLOF: Independent  PATIENT GOALS: reduce back pain, avoid surgery for neck/shoulder, would like to feel thumb again   OBJECTIVE:   DIAGNOSTIC FINDINGS:  MRI last week - no results yet per pt, none in chart  PATIENT SURVEYS:  FOTO: 47% eval, 58% predicted  RED FLAGS: Red flag questioning unremarkable - no bowel/bladder issues, swallowing, headaches/vision changes, no B numbness or saddle anesthesia, etc  COGNITION: Overall cognitive status: Within functional limits for tasks assessed  SENSATION: Light touch intact all extremities aside from diminished LUE on C4, C5, C6, and T1; baseline R LE deficits posterolateral but otherwise intact throughout all dermatomes LE  POSTURE: forward head posture, elevated UT B  PALPATION: Deferred on eval due to time constraints   CERVICAL ROM:   Active ROM A/PROM (deg) eval  Flexion 75% p!  Extension 100% no pain but does worsen numbness in arm   Right lateral flexion   Left lateral flexion   Right rotation 55  Left rotation 70 describes as stiff   (Blank rows = not tested)  LUMBAR ROM:   Active  A/PROM  eval  Flexion   Extension   Right lateral flexion   Left lateral flexion   Right rotation   Left rotation    (Blank rows = not tested)  Comments: deferred on eval due to time constraints  UPPER EXTREMITY ROM:  Active ROM Right eval Left eval  Shoulder flexion WNL Grossly WNL although does report inc pain/numbness  Shoulder abduction    Shoulder internal rotation    Shoulder external rotation    Elbow flexion    Elbow extension    Wrist flexion    Wrist extension     (Blank rows = not tested) Comments:  UPPER EXTREMITY MMT:  MMT Right eval Left eval  Shoulder flexion 5 4+  Shoulder extension    Shoulder abduction 5 5  Shoulder extension    Shoulder  internal rotation  Shoulder external rotation    Elbow flexion 5 4  Elbow extension 5 4  Grip strength     (Blank rows = not tested)  Comments: No pain but pt reports subjective weakness   CERVICAL SPECIAL TESTS:  Positive spurlings on L, pain + increased numbness Ulnar nerve test more symptomatic on L Negative median nerve test B Positive radial nerve test on L   TODAY'S TREATMENT:                                                                                                                              [facility] Adult PT Treatment:                                                DATE: 02/01/22 Therapeutic Exercise: Scap retractions x10 seated Chin tucks x5, seated  PATIENT EDUCATION:  Education details: Pt education on PT impairments, prognosis, and POC. Informed consent. Rationale for interventions, safe/appropriate HEP performance. Significant time spent with education re: exam findings, possible underlying pain physiology, typical course of care from PT perspective Person educated: Patient Education method: Explanation, Demonstration, Tactile cues, Verbal cues, and Handouts Education comprehension: verbalized understanding, returned demonstration, verbal cues required, tactile cues required, and needs further education   HOME EXERCISE PROGRAM: Access Code: [email or web address] URL: [email or web address] Date: 02/01/2022 Prepared by: Enis Slipper  Exercises - Seated Scapular Retraction  - 1 x daily - 7 x weekly - 3 sets - 10 reps  ASSESSMENT:  CLINICAL IMPRESSION: Pt is a 41 year old woman who arrives to PT evaluation on this date for neck and back pain. Pt reports difficulty with majority of daily activities and recreational activities due to pain, has begun to require assist from spouse for self care and housework. Today's evaluation focusing on cervical/UE symptoms due to time constraints, will plan to assess low back further as able/indicated. On examination, pt  demonstrates impairments in cervical mobility and LUE strength, sensory impairments on LUE throughout multiple dermatomes. Concordant UE pain/numbness provoked with spurlings test on L. Pt pleasantly engaged/inquisitive throughout, time spent with education regarding exam findings and course of PT as described above. Pt tolerates scap retractions well for HEP without significant increase in pain - attempted to initiate chin tucks for improved cervical stability but limited tolerance, deferred further repetition until later date. No adverse events or significant increase in symptoms. Recommend skilled PT to address aforementioned deficits to improve functional independence/tolerance.   OBJECTIVE IMPAIRMENTS: decreased activity tolerance, decreased coordination, decreased endurance, decreased mobility, difficulty walking, decreased ROM, decreased strength, increased muscle spasms, impaired sensation, impaired UE functional use, postural dysfunction, and pain.   ACTIVITY LIMITATIONS: carrying, lifting, sitting, standing, sleeping, dressing, reach over head, and hygiene/grooming  PARTICIPATION LIMITATIONS: meal prep, cleaning, laundry, driving, shopping, community activity, and occupation  PERSONAL FACTORS: Past/current experiences and 1-2  comorbidities: anxiety, past [facility]K surgeries  are also affecting patient's functional outcome.   REHAB POTENTIAL: Good  CLINICAL DECISION MAKING: Evolving/moderate complexity  EVALUATION COMPLEXITY: Moderate   GOALS: Goals reviewed with patient? No  SHORT TERM GOALS: Target date: 03/01/2022   Pt will demonstrate appropriate understanding and performance of initially prescribed HEP in order to facilitate improved independence with management of symptoms.  Baseline: HEP provided on eval Goal status: INITIAL   2. Pt will score greater than or equal to 54 on FOTO in order to demonstrate improved perception of function due to symptoms.  Baseline: 47  Goal status:  INITIAL   LONG TERM GOALS: Target date: 03/29/2022  Pt will score 58 on FOTO in order to demonstrate improved perception of functional status due to symptoms.  Baseline: 47 Goal status: INITIAL  2.  Pt will demonstrate grossly symmetrical UE MMT for improved functional independence w/ UE tasks.  Baseline: see MMT chart above Goal status: INITIAL  3.  Pt will demonstrate at least 60deg B cervical rotation for improved environmental awareness/safety while driving. Baseline: see ROM chart above Goal status: INITIAL  4. Pt will report ability to perform upper body dressing with less than 2pt increase in pain on NPS compared to baseline.  Baseline: up to 10/10 neck/UE with upper body dressing  Goal status: INITIAL    PLAN:  PT FREQUENCY: 2x/week  PT DURATION: 8 weeks  PLANNED INTERVENTIONS: Therapeutic exercises, Therapeutic activity, Neuromuscular re-education, Balance training, Gait training, Patient/Family education, Self Care, Joint mobilization, Aquatic Therapy, Dry Needling, Electrical stimulation, Spinal mobilization, Cryotherapy, Moist heat, Taping, Manual therapy, and Re-evaluation  PLAN FOR NEXT SESSION: Nerve glides as tolerated, postural stability/activation exercises. Manual/modalities as indicated. Assess lumbar/palpation    Leeroy Cha PT, DPT 02/01/2022 12:26 PM

## 2022-02-01 ENCOUNTER — Encounter: Payer: Self-pay | Admitting: Physical Therapy

## 2022-02-01 ENCOUNTER — Ambulatory Visit: Payer: Medicare Other | Attending: Student | Admitting: Physical Therapy

## 2022-02-01 ENCOUNTER — Other Ambulatory Visit: Payer: Self-pay

## 2022-02-01 DIAGNOSIS — M5440 Lumbago with sciatica, unspecified side: Secondary | ICD-10-CM | POA: Insufficient documentation

## 2022-02-01 DIAGNOSIS — R279 Unspecified lack of coordination: Secondary | ICD-10-CM | POA: Diagnosis not present

## 2022-02-01 DIAGNOSIS — M5412 Radiculopathy, cervical region: Secondary | ICD-10-CM | POA: Diagnosis not present

## 2022-02-01 DIAGNOSIS — M542 Cervicalgia: Secondary | ICD-10-CM

## 2022-02-01 DIAGNOSIS — M62838 Other muscle spasm: Secondary | ICD-10-CM | POA: Insufficient documentation

## 2022-02-01 DIAGNOSIS — M6281 Muscle weakness (generalized): Secondary | ICD-10-CM

## 2022-02-01 DIAGNOSIS — R262 Difficulty in walking, not elsewhere classified: Secondary | ICD-10-CM | POA: Insufficient documentation

## 2022-02-01 DIAGNOSIS — R293 Abnormal posture: Secondary | ICD-10-CM | POA: Diagnosis not present

## 2022-02-07 ENCOUNTER — Ambulatory Visit: Payer: Medicare Other | Admitting: Physical Therapy

## 2022-02-14 ENCOUNTER — Encounter: Payer: Self-pay | Admitting: Physical Therapy

## 2022-02-14 ENCOUNTER — Ambulatory Visit: Payer: Medicare Other | Admitting: Physical Therapy

## 2022-02-14 DIAGNOSIS — M542 Cervicalgia: Secondary | ICD-10-CM

## 2022-02-14 DIAGNOSIS — M5412 Radiculopathy, cervical region: Secondary | ICD-10-CM | POA: Diagnosis not present

## 2022-02-14 DIAGNOSIS — M6281 Muscle weakness (generalized): Secondary | ICD-10-CM

## 2022-02-14 DIAGNOSIS — R293 Abnormal posture: Secondary | ICD-10-CM

## 2022-02-14 NOTE — Therapy (Signed)
OUTPATIENT PHYSICAL THERAPY CERVICAL/LUMBAR EVALUATION   Patient Name: Linda BihariMary H Omlor MRN: [id number] DOB:1980-07-04, 41 y.o., female Today's Date: 02/14/2022   PT End of Session - 02/14/22 1009     Visit Number 2    Number of Visits 17    Date for PT Re-Evaluation 03/29/22    Authorization - Visit Number 2    Progress Note Due on Visit 10    PT Start Time 0933    PT Stop Time 1011    PT Time Calculation (min) 38 min    Activity Tolerance Patient tolerated treatment well;Patient limited by pain    Behavior [facility] for tasks assessed/performed              Past Medical History:  Diagnosis Date   Anemia    Anxiety    Hypertension    as a 41 year old - none after losing weight   Ruptured lumbar disc    Past Surgical History:  Procedure Laterality Date   LEEP     LUMBAR LAMINECTOMY/DECOMPRESSION MICRODISCECTOMY Right 06/11/2017   Procedure: Microdiscectomy - right - L5-S1;  Surgeon: Donalee Citrinram, Gary, MD;  [facility];  Service: Neurosurgery;  Laterality: Right;   LUMBAR LAMINECTOMY/DECOMPRESSION MICRODISCECTOMY Right 08/26/2018   Procedure: Microdiscectomy - right - L5-S1 redo;  Surgeon: Donalee Citrinram, Gary, MD;  [facility];  Service: Neurosurgery;  Laterality: Right;  Microdiscectomy - right - L5-S1 redo   Patient Active Problem List   Diagnosis Date Noted   Pain in left hip 07/29/2021   HNP (herniated nucleus pulposus), lumbar 06/11/2017    PCP: none in chart  REFERRING PROVIDER: Sherryl MangesMeyran, Kimberly Hannah, NP  REFERRING DIAG: M54.12 (ICD-10-CM) - Radiculopathy, cervical region M54.40 (ICD-10-CM) - Lumbago with sciatica, unspecified side  THERAPY DIAG:  Cervicalgia  Muscle weakness (generalized)  Abnormal posture  Rationale for Evaluation and Treatment: Rehabilitation  ONSET DATE: Mid September, gradually worsening, no provoking incident  SUBJECTIVE:                                                                                                                                                                                                          SUBJECTIVE STATEMENT: Pt states she still has numbness in her fingers, some soreness in her neck. She states her low back feels "tight"  PERTINENT HISTORY:  Spinal fusion lumbar spine 2021, prior laminectomies and decompression  PAIN:  Are you having pain: yes, 4-5/10 back Location: neck - L side (along UT, shoulder blade, shoulder), refers into medial UE down to finger; back centered around  tailbone How would you describe your pain? Back - stiff, neck shooting pain Aggravating factors: laying on back (neck), sitting or standing too long (back), being in car (both), upper body dressing and washing hair Easing factors: positioning, ibuprofen, moving, TENS, ice/heat  PRECAUTIONS: None  WEIGHT BEARING RESTRICTIONS: No  FALLS:  Has patient fallen in last 6 months? No   PATIENT GOALS: reduce back pain, avoid surgery for neck/shoulder, would like to feel thumb again   OBJECTIVE:   CERVICAL ROM:   Active ROM A/PROM (deg) eval  Flexion 75% p!  Extension 100% no pain but does worsen numbness in arm   Right lateral flexion   Left lateral flexion   Right rotation 55  Left rotation 70 describes as stiff   (Blank rows = not tested)  LUMBAR ROM:   Active  A/PROM  02/14/22  Flexion WFL  Extension Limited 50%  Right lateral flexion To knee  Left lateral flexion To knee  Right rotation WFL  Left rotation WFL   (Blank rows = not tested)  Comments: deferred on eval due to time constraints  UPPER EXTREMITY ROM:  Active ROM Right eval Left eval  Shoulder flexion WNL Grossly WNL although does report inc pain/numbness  Shoulder abduction    Shoulder internal rotation    Shoulder external rotation    Elbow flexion    Elbow extension    Wrist flexion    Wrist extension     (Blank rows = not tested) Comments:  UPPER EXTREMITY MMT:  MMT Right eval Left eval  Shoulder flexion  5 4+  Shoulder extension    Shoulder abduction 5 5  Shoulder extension    Shoulder internal rotation    Shoulder external rotation    Elbow flexion 5 4  Elbow extension 5 4  Grip strength     (Blank rows = not tested)  Comments: No pain but pt reports subjective weakness   CERVICAL SPECIAL TESTS:  Positive spurlings on L, pain + increased numbness Ulnar nerve test more symptomatic on L Negative median nerve test B Positive radial nerve test on L   TODAY'S TREATMENT:                                                                                                                              [facility] Adult PT Treatment:                                                DATE: 02/14/22 Therapeutic Exercise: Nustep L6 x 4 min for warm up Ab isometrics supine 90/90 3 sec hold x 10 Oblique isometrics supine 90/90 3 sec hold x 10 Shoulder extension green TB x 10 Row green TB x 10 Pallof press red TB x 10 bilat Manual Therapy: STM lumbar paraspinals Skilled palpation to assess effects  of dry needling Trigger Point Dry-Needling  Treatment instructions: Expect mild to moderate muscle soreness. S/S of pneumothorax if dry needled over a lung field, and to seek immediate medical attention should they occur. Patient verbalized understanding of these instructions and education.  Patient Consent Given: Yes Education handout provided: Yes Muscles treated: lumbar paraspinals Electrical stimulation performed: No Parameters: N/A Treatment response/outcome: attempted TPDN Rt lumbar paraspinals. 2 attempts, pt with increased pain so treatment discontinued   [facility] Adult PT Treatment:                                                DATE: 02/01/22 Therapeutic Exercise: Scap retractions x10 seated Chin tucks x5, seated  PATIENT EDUCATION:  Education details: Pt education on PT impairments, prognosis, and POC. Informed consent. Rationale for interventions, safe/appropriate HEP performance. Significant time  spent with education re: exam findings, possible underlying pain physiology, typical course of care from PT perspective Person educated: Patient Education method: Explanation, Demonstration, Tactile cues, Verbal cues, and Handouts Education comprehension: verbalized understanding, returned demonstration, verbal cues required, tactile cues required, and needs further education   HOME EXERCISE PROGRAM: Access Code: [email or web address] URL: [email or web address] Date: 02/14/2022 Prepared by: Reggy Eye  Exercises - Seated Scapular Retraction  - 1 x daily - 7 x weekly - 3 sets - 10 reps - Supine 90/90 Abdominal Bracing  - 1 x daily - 7 x weekly - 2 sets - 10 reps - 3 sec hold hold - Shoulder extension with resistance - Neutral  - 1 x daily - 7 x weekly - 3 sets - 10 reps - Standing Shoulder Row with Anchored Resistance  - 1 x daily - 7 x weekly - 3 sets - 10 reps - Standing Anti-Rotation Press with Anchored Resistance  - 1 x daily - 7 x weekly - 3 sets - 10 reps  ASSESSMENT:  CLINICAL IMPRESSION: Treatment session focused on lumbar strength and mobility. Trial of dry needling discontinued due to increased pain. Updated HEP.  OBJECTIVE IMPAIRMENTS: decreased activity tolerance, decreased coordination, decreased endurance, decreased mobility, difficulty walking, decreased ROM, decreased strength, increased muscle spasms, impaired sensation, impaired UE functional use, postural dysfunction, and pain.    GOALS: Goals reviewed with patient? No  SHORT TERM GOALS: Target date: 03/01/2022   Pt will demonstrate appropriate understanding and performance of initially prescribed HEP in order to facilitate improved independence with management of symptoms.  Baseline: HEP provided on eval Goal status: INITIAL   2. Pt will score greater than or equal to 54 on FOTO in order to demonstrate improved perception of function due to symptoms.  Baseline: 47  Goal status: INITIAL   LONG TERM GOALS:  Target date: 03/29/2022  Pt will score 58 on FOTO in order to demonstrate improved perception of functional status due to symptoms.  Baseline: 47 Goal status: INITIAL  2.  Pt will demonstrate grossly symmetrical UE MMT for improved functional independence w/ UE tasks.  Baseline: see MMT chart above Goal status: INITIAL  3.  Pt will demonstrate at least 60deg B cervical rotation for improved environmental awareness/safety while driving. Baseline: see ROM chart above Goal status: INITIAL  4. Pt will report ability to perform upper body dressing with less than 2pt increase in pain on NPS compared to baseline.  Baseline: up to 10/10 neck/UE with upper body dressing  Goal status: INITIAL  PLAN:  PT FREQUENCY: 2x/week  PT DURATION: 8 weeks  PLANNED INTERVENTIONS: Therapeutic exercises, Therapeutic activity, Neuromuscular re-education, Balance training, Gait training, Patient/Family education, Self Care, Joint mobilization, Aquatic Therapy, Dry Needling, Electrical stimulation, Spinal mobilization, Cryotherapy, Moist heat, Taping, Manual therapy, and Re-evaluation  PLAN FOR NEXT SESSION: Nerve glides as tolerated, postural stability/activation exercises. Manual/modalities as indicated.  Reggy Eye, PT,DPT11/21/2310:10 AM

## 2022-02-23 ENCOUNTER — Encounter: Payer: Self-pay | Admitting: Physical Therapy

## 2022-02-23 ENCOUNTER — Ambulatory Visit: Payer: Medicare Other | Admitting: Physical Therapy

## 2022-02-23 DIAGNOSIS — M542 Cervicalgia: Secondary | ICD-10-CM

## 2022-02-23 DIAGNOSIS — M6281 Muscle weakness (generalized): Secondary | ICD-10-CM

## 2022-02-23 DIAGNOSIS — R293 Abnormal posture: Secondary | ICD-10-CM

## 2022-02-23 DIAGNOSIS — M5412 Radiculopathy, cervical region: Secondary | ICD-10-CM | POA: Diagnosis not present

## 2022-02-23 NOTE — Therapy (Addendum)
OUTPATIENT PHYSICAL THERAPY TREATMENT AND DISCHARGE   Patient Name: Linda Roberts MRN: [id number] DOB:1981/02/10, 41 y.o., female Today's Date: 02/23/2022   PT End of Session - 02/23/22 1217     Visit Number 3    Number of Visits 17    Date for PT Re-Evaluation 03/29/22    Authorization Type Medicare [employer]    Authorization - Visit Number 3    Progress Note Due on Visit 10    PT Start Time 1141    PT Stop Time 1217    PT Time Calculation (min) 36 min    Activity Tolerance Patient tolerated treatment well    Behavior During Therapy WFL for tasks assessed/performed               Past Medical History:  Diagnosis Date   Anemia    Anxiety    Hypertension    as a 41 year old - none after losing weight   Ruptured lumbar disc    Past Surgical History:  Procedure Laterality Date   LEEP     LUMBAR LAMINECTOMY/DECOMPRESSION MICRODISCECTOMY Right 06/11/2017   Procedure: Microdiscectomy - right - L5-S1;  Surgeon: Kary Kos, MD;  Location: [place];  Service: Neurosurgery;  Laterality: Right;   LUMBAR LAMINECTOMY/DECOMPRESSION MICRODISCECTOMY Right 08/26/2018   Procedure: Microdiscectomy - right - L5-S1 redo;  Surgeon: Kary Kos, MD;  Location: [place];  Service: Neurosurgery;  Laterality: Right;  Microdiscectomy - right - L5-S1 redo   Patient Active Problem List   Diagnosis Date Noted   Pain in left hip 07/29/2021   HNP (herniated nucleus pulposus), lumbar 06/11/2017    PCP: none in chart  REFERRING PROVIDER: Eleonore Chiquito, NP  REFERRING DIAG: M54.12 (ICD-10-CM) - Radiculopathy, cervical region M54.40 (ICD-10-CM) - Lumbago with sciatica, unspecified side  THERAPY DIAG:  Cervicalgia  Muscle weakness (generalized)  Abnormal posture  Rationale for Evaluation and Treatment: Rehabilitation  ONSET DATE: Mid September, gradually worsening, no provoking incident  SUBJECTIVE:                                                                                                                                                                                                          SUBJECTIVE STATEMENT: Pt states she had an epidural and states her upper back/neck pain has decreased a lot. She states she still has a little numbness in her thumb but overall much better. Her low back is still "tight"  PERTINENT HISTORY:  Spinal fusion lumbar spine 2021, prior laminectomies and decompression  PAIN:  Are you having pain: yes,  4-5/10 back Location: low back How would you describe your pain? Back - stiff Aggravating factors: laying on back (neck), sitting or standing too long (back), being in car (both), upper body dressing and washing hair Easing factors: positioning, ibuprofen, moving, TENS, ice/heat  PRECAUTIONS: None  WEIGHT BEARING RESTRICTIONS: No  FALLS:  Has patient fallen in last 6 months? No   PATIENT GOALS: reduce back pain, avoid surgery for neck/shoulder, would like to feel thumb again   OBJECTIVE:   CERVICAL ROM:   Active ROM A/PROM (deg) eval 11/30  Flexion 75% p!   Extension 100% no pain but does worsen numbness in arm    Right lateral flexion    Left lateral flexion    Right rotation 55 80  Left rotation 70 describes as stiff    (Blank rows = not tested)  LUMBAR ROM:   Active  A/PROM  02/14/22  Flexion WFL  Extension Limited 50%  Right lateral flexion To knee  Left lateral flexion To knee  Right rotation WFL  Left rotation WFL   (Blank rows = not tested)  Comments: deferred on eval due to time constraints  UPPER EXTREMITY ROM:  Active ROM Right eval Left eval  Shoulder flexion WNL Grossly WNL although does report inc pain/numbness  Shoulder abduction    Shoulder internal rotation    Shoulder external rotation    Elbow flexion    Elbow extension    Wrist flexion    Wrist extension     (Blank rows = not tested) Comments:  UPPER EXTREMITY MMT:  MMT Right eval Left eval Left 11/30  Shoulder flexion 5 4+    Shoulder extension     Shoulder abduction 5 5   Shoulder extension     Shoulder internal rotation     Shoulder external rotation     Elbow flexion 5 4 4+  Elbow extension 5 4 4+  Grip strength      (Blank rows = not tested)  Comments: No pain but pt reports subjective weakness   CERVICAL SPECIAL TESTS:  Positive spurlings on L, pain + increased numbness Ulnar nerve test more symptomatic on L Negative median nerve test B Positive radial nerve test on L  FOTO 53 on 11/30 TODAY'S TREATMENT:                                                                                                                              [facility] Adult PT Treatment:                                                DATE: 02/23/22 Therapeutic Exercise: Treadmill walking x 5 min 2.39mh increasing incline from 0-3 Row green TB x 20 Shoulder extension green TB x 20 Pallof green TB x 20 bilat Anti rotation with march green  TB x 10 bilat Wall push up x 10 Ab isometrics 3 sec hold x 10 Oblique isometric 3 sec hold x 10 bilat    [facility] Adult PT Treatment:                                                DATE: 02/14/22 Therapeutic Exercise: Nustep L6 x 4 min for warm up Ab isometrics supine 90/90 3 sec hold x 10 Oblique isometrics supine 90/90 3 sec hold x 10 Shoulder extension green TB x 10 Row green TB x 10 Pallof press red TB x 10 bilat Manual Therapy: STM lumbar paraspinals Skilled palpation to assess effects of dry needling Trigger Point Dry-Needling  Treatment instructions: Expect mild to moderate muscle soreness. S/S of pneumothorax if dry needled over a lung field, and to seek immediate medical attention should they occur. Patient verbalized understanding of these instructions and education.  Patient Consent Given: Yes Education handout provided: Yes Muscles treated: lumbar paraspinals Electrical stimulation performed: No Parameters: N/A Treatment response/outcome: attempted TPDN Rt lumbar paraspinals.  2 attempts, pt with increased pain so treatment discontinued   PATIENT EDUCATION:  Education details: Pt education on PT impairments, prognosis, and POC. Informed consent. Rationale for interventions, safe/appropriate HEP performance. Significant time spent with education re: exam findings, possible underlying pain physiology, typical course of care from PT perspective Person educated: Patient Education method: Explanation, Demonstration, Tactile cues, Verbal cues, and Handouts Education comprehension: verbalized understanding, returned demonstration, verbal cues required, tactile cues required, and needs further education   HOME EXERCISE PROGRAM: Access Code: [email or web address] URL: [email or web address] Date: 02/14/2022 Prepared by: Isabelle Course  Exercises - Seated Scapular Retraction  - 1 x daily - 7 x weekly - 3 sets - 10 reps - Supine 90/90 Abdominal Bracing  - 1 x daily - 7 x weekly - 2 sets - 10 reps - 3 sec hold hold - Shoulder extension with resistance - Neutral  - 1 x daily - 7 x weekly - 3 sets - 10 reps - Standing Shoulder Row with Anchored Resistance  - 1 x daily - 7 x weekly - 3 sets - 10 reps - Standing Anti-Rotation Press with Anchored Resistance  - 1 x daily - 7 x weekly - 3 sets - 10 reps  ASSESSMENT:  CLINICAL IMPRESSION: Pt has decreased pain and improved ROM. She has improved functional activity tolerance and is progressing towards goals. Pt states she feels comfortable to continue on her own with HEP. Plan to hold PT at this time  OBJECTIVE IMPAIRMENTS: decreased activity tolerance, decreased coordination, decreased endurance, decreased mobility, difficulty walking, decreased ROM, decreased strength, increased muscle spasms, impaired sensation, impaired UE functional use, postural dysfunction, and pain.    GOALS: Goals reviewed with patient? No  SHORT TERM GOALS: Target date: 03/01/2022   Pt will demonstrate appropriate understanding and performance of  initially prescribed HEP in order to facilitate improved independence with management of symptoms.  Baseline: HEP provided on eval Goal status: MET  2. Pt will score greater than or equal to 54 on FOTO in order to demonstrate improved perception of function due to symptoms.  Baseline: 47  Goal status: NOT MET   LONG TERM GOALS: Target date: 03/29/2022  Pt will score 58 on FOTO in order to demonstrate improved perception of functional status due to symptoms.  Baseline: 47 Goal status: NOT  MET  2.  Pt will demonstrate grossly symmetrical UE MMT for improved functional independence w/ UE tasks.  Baseline: see MMT chart above Goal status: NOT MET  3.  Pt will demonstrate at least 60deg B cervical rotation for improved environmental awareness/safety while driving. Baseline: see ROM chart above Goal status: MET  4. Pt will report ability to perform upper body dressing with less than 2pt increase in pain on NPS compared to baseline.  Baseline: up to 10/10 neck/UE with upper body dressing  Goal status: MET    PLAN:  PT FREQUENCY: 2x/week  PT DURATION: 8 weeks  PLANNED INTERVENTIONS: Therapeutic exercises, Therapeutic activity, Neuromuscular re-education, Balance training, Gait training, Patient/Family education, Self Care, Joint mobilization, Aquatic Therapy, Dry Needling, Electrical stimulation, Spinal mobilization, Cryotherapy, Moist heat, Taping, Manual therapy, and Re-evaluation  PLAN FOR NEXT SESSION: HOLD PT  PHYSICAL THERAPY DISCHARGE SUMMARY  Visits from Start of Care: 3  Current functional level related to goals / functional outcomes: Decreased pain, improved mobility and ROM   Remaining deficits: See above   Education / Equipment: HEP   Patient agrees to discharge. Patient goals were met. Patient is being discharged due to being pleased with the current functional level. Isabelle Course, PT,DPT12/15/2310:37 AM  Isabelle Course, PT,DPT11/30/2312:18 PM

## 2022-02-28 ENCOUNTER — Encounter: Payer: Medicare Other | Admitting: Physical Therapy

## 2022-03-02 ENCOUNTER — Encounter: Payer: Medicare Other | Admitting: Physical Therapy

## 2022-05-01 ENCOUNTER — Emergency Department (HOSPITAL_BASED_OUTPATIENT_CLINIC_OR_DEPARTMENT_OTHER)
Admission: EM | Admit: 2022-05-01 | Discharge: 2022-05-01 | Disposition: A | Discharge status: Home / Self Care | Payer: Medicare HMO | Attending: Emergency Medicine | Admitting: Emergency Medicine

## 2022-05-01 ENCOUNTER — Emergency Department (HOSPITAL_BASED_OUTPATIENT_CLINIC_OR_DEPARTMENT_OTHER): Payer: Medicare HMO

## 2022-05-01 DIAGNOSIS — S2231XA Fracture of one rib, right side, initial encounter for closed fracture: Secondary | ICD-10-CM | POA: Diagnosis not present

## 2022-05-01 DIAGNOSIS — R0781 Pleurodynia: Secondary | ICD-10-CM | POA: Diagnosis present

## 2022-05-01 DIAGNOSIS — X58XXXA Exposure to other specified factors, initial encounter: Secondary | ICD-10-CM | POA: Diagnosis not present

## 2022-05-01 LAB — URINALYSIS, MICROSCOPIC (REFLEX): RBC / HPF: NONE SEEN RBC/hpf (ref 0–5)

## 2022-05-01 LAB — URINALYSIS, ROUTINE W REFLEX MICROSCOPIC
Bilirubin Urine: NEGATIVE
Glucose, UA: NEGATIVE mg/dL
Hgb urine dipstick: NEGATIVE
Ketones, ur: NEGATIVE mg/dL
Nitrite: NEGATIVE
Protein, ur: NEGATIVE mg/dL
Specific Gravity, Urine: 1.015 (ref 1.005–1.030)
pH: 8.5 — ABNORMAL HIGH (ref 5.0–8.0)

## 2022-05-01 LAB — PREGNANCY, URINE: Preg Test, Ur: NEGATIVE

## 2022-05-01 MED ORDER — KETOROLAC TROMETHAMINE 30 MG/ML IJ SOLN
30.0000 mg | Freq: Once | INTRAMUSCULAR | Status: AC
  Administered 2022-05-01: 30 mg via INTRAMUSCULAR
  Filled 2022-05-01: qty 1

## 2022-05-01 MED ORDER — LIDOCAINE 4 % EX CREA
TOPICAL_CREAM | Freq: Once | CUTANEOUS | Status: AC
  Administered 2022-05-01: 1 via TOPICAL
  Filled 2022-05-01: qty 5

## 2022-05-01 MED ORDER — ONDANSETRON 4 MG PO TBDP
4.0000 mg | ORAL_TABLET | Freq: Once | ORAL | Status: AC
  Administered 2022-05-01: 4 mg via ORAL
  Filled 2022-05-01: qty 1

## 2022-05-01 MED ORDER — KETOROLAC TROMETHAMINE 10 MG PO TABS: 10.0000 mg | ORAL_TABLET | Freq: Four times a day (QID) | ORAL | 0 refills | Status: AC | PRN

## 2022-05-01 MED ORDER — LIDOCAINE 5 % EX PTCH: 1.0000 | MEDICATED_PATCH | CUTANEOUS | 0 refills | Status: AC

## 2022-05-01 NOTE — Discharge Instructions (Addendum)
Please follow-up with your primary care doctor, rib fracture can take 4 to 6 weeks to heal.  You can use the Toradol I prescribed you or Tylenol to help with your pain control.  Also I have prescribed you lidocaine patches, you can use this to help with the pain.  If you develop severe shortness of breath, please return to the ER.

## 2022-05-01 NOTE — ED Triage Notes (Signed)
Pt states she has had URI symptoms x 3 weeks.  [facility] and [facility] for a sinus infection but the next morning felt better so she didn't begin antibiotics.  She does state that she coughed so much that her right side is in extreme pain and feels something must be "pulled or broken"   URI symptoms are much better at this time.

## 2022-05-01 NOTE — ED Provider Notes (Signed)
[facility] Provider Note   CSN: [id number] Arrival date & time: 05/01/22  1059     History  Chief Complaint  Patient presents with   Flank Pain    Linda Roberts is a 42 y.o. 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  She states that she had an upper respiratory infection a couple weeks ago and was coughing a lot, and started having some right rib cage pain.  States that the pain has been persistent, worse when taking deep breaths, coughing.  Has not tried anything for the pain, states that is intractable, so wanted to be further checked out.  Also endorses some urinary frequency.  Denies any shortness of breath, chest pain, nausea, vomiting, diarrhea.     Home Medications Prior to Admission medications   Medication Sig Start Date End Date Taking? Authorizing Provider  ketorolac (TORADOL) 10 MG tablet Take 1 tablet (10 mg total) by mouth every 6 (six) hours as needed. 05/01/22  Yes Zaryan Yakubov L, PA  lidocaine (LIDODERM) 5 % Place 1 patch onto the skin daily. Remove & Discard patch within 12 hours or as directed by MD 05/01/22  Yes Montrez Marietta L, PA  ELDERBERRY PO Take 2 tablets by mouth daily at 12 noon. Gummie    [provider]  levonorgestrel (MIRENA) 20 MCG/24HR IUD 1 each by Intrauterine route once.    [provider]  meloxicam (MOBIC) 15 MG tablet Take 1 tablet (15 mg total) by mouth daily. Patient taking differently: Take 7.5 mg by mouth 2 (two) times daily. 06/03/17   Maczis, Barth Kirks, PA-C  methocarbamol (ROBAXIN) 500 MG tablet Take 1 tablet (500 mg total) by mouth every 6 (six) hours as needed for muscle spasms (IF FLEXERIL INEFFECTIVE). 03/25/19   Kary Kos, MD  ondansetron (ZOFRAN-ODT) 4 MG disintegrating tablet Take 4 mg by mouth every 8 (eight) hours as needed for nausea or  vomiting.    [provider]  traMADol (ULTRAM) 50 MG tablet Take 50 mg by mouth 2 (two) times daily.     [provider]      Allergies    Patient has no known allergies.    Review of Systems   Review of Systems  Respiratory:  Negative for shortness of breath.   Cardiovascular:  Negative for chest pain.  Genitourinary:  Positive for flank pain.    Physical Exam Updated Vital Signs BP 121/79   Pulse 63   Temp 98.3 F (36.8 C) (Oral)   Resp 16   SpO2 100%  Physical Exam Vitals and nursing note reviewed.  Constitutional:      General: She is not in acute distress.    Appearance: She is well-developed.  HENT:     Head: Normocephalic and atraumatic.  Eyes:     Conjunctiva/sclera: Conjunctivae normal.  Cardiovascular:     Rate and Rhythm: Normal rate and regular rhythm.     Heart sounds: No murmur heard. Pulmonary:     Effort: Pulmonary effort is normal. No respiratory distress.     Breath sounds: Normal breath sounds.  Abdominal:     Palpations: Abdomen is soft.     Tenderness: There is no abdominal tenderness.  Musculoskeletal:        General: No swelling.     Cervical back: Neck supple.  Comments: +right lateral rib cage pain--ttp.  Skin:    General: Skin is warm and dry.     Capillary Refill: Capillary refill takes less than 2 seconds.     Findings: No abrasion, ecchymosis, erythema or rash.  Neurological:     Mental Status: She is alert.  Psychiatric:        Mood and Affect: Mood normal.     ED Results / Procedures / Treatments   Labs (all labs ordered are listed, but only abnormal results are displayed) Labs Reviewed  PREGNANCY, URINE  URINALYSIS, ROUTINE W REFLEX MICROSCOPIC    EKG None  Radiology DG Ribs Unilateral W/Chest Right  Result Date: 05/01/2022 CLINICAL DATA:  Cough.  Right-sided rib pain. EXAM: RIGHT RIBS AND CHEST - 3 VIEW COMPARISON:  None Available. FINDINGS: There is likely a nondisplaced fracture through the  right lateral ninth rib. There is no evidence of pneumothorax or pleural effusion. Both lungs are clear. Heart size and mediastinal contours are within normal limits. IMPRESSION: Probable nondisplaced fracture through the right lateral ninth rib. No pneumothorax or pleural effusion. Electronically Signed   By: Marin Roberts M.D.   On: 05/01/2022 12:30    Procedures Procedures    Medications Ordered in ED Medications  lidocaine (LMX) 4 % cream (1 Application Topical Given 05/01/22 1217)  ketorolac (TORADOL) 30 MG/ML injection 30 mg (30 mg Intramuscular Given 05/01/22 1217)  ondansetron (ZOFRAN-ODT) disintegrating tablet 4 mg (4 mg Oral Given 05/01/22 1352)    ED Course/ Medical Decision Making/ A&P                             Medical Decision Making Patient is a 42 year old female, here for right rib cage pain for the last week after coughing/laughing very hard.  She has tenderness to palpation of the lower right rib cage, no crepitus, rash, edema, ecchymosis noted.  Will obtain a chest x-ray with ribs to further evaluate.  Also reports some urinary frequency, we will check urinalysis for further evaluation.  Denies any nausea, vomiting, chest pain.  Is not on any birth control, not tachycardic no recent travel, and nonhypoxic.  Amount and/or Complexity of Data Reviewed Labs: ordered. Radiology: ordered.    Details: X-ray shows possible nondisplaced ninth rib fracture. Discussion of management or test interpretation with external provider(s): Discussed with patient, refractory to last 4 to 6 weeks, will heal on its own, discussed return precautions, and treatment for home, she voiced understanding, discharged home.    Risk OTC drugs. Prescription drug management.   Final Clinical Impression(s) / ED Diagnoses Final diagnoses:  Closed fracture of one rib of right side, initial encounter    Rx / DC Orders ED Discharge Orders          Ordered    ketorolac (TORADOL) 10 MG tablet  Every 6  hours PRN        05/01/22 1310    lidocaine (LIDODERM) 5 %  Every 24 hours        05/01/22 1310              Darien Mignogna, [place], [place] 05/01/22 1407    Margette Fast, MD 05/05/22 [phone]

## 2022-08-26 IMAGING — CT CT L SPINE W/O CM
1 of 7 series · 6 of 14 positions shown, 8 images · non-contrast
Comparison: Radiography 06/12/2019.  MRI 02/18/2019.

CLINICAL DATA: Lumbar region back pain with right hip and leg pain
since Friday February, 2017. Surgery February 2019.

EXAM:
CT LUMBAR SPINE WITHOUT CONTRAST
TECHNIQUE: Multidetector CT imaging of the lumbar spine was performed without
intravenous contrast administration. Multiplanar CT image
reconstructions were also generated.

[Series 3: l spine soft (person_name) · axial · 0.31mm/px · z∈[+628,+788]mm · 6 of 114 slices shown, 8 images]
[im 17/114  soft-tissue]
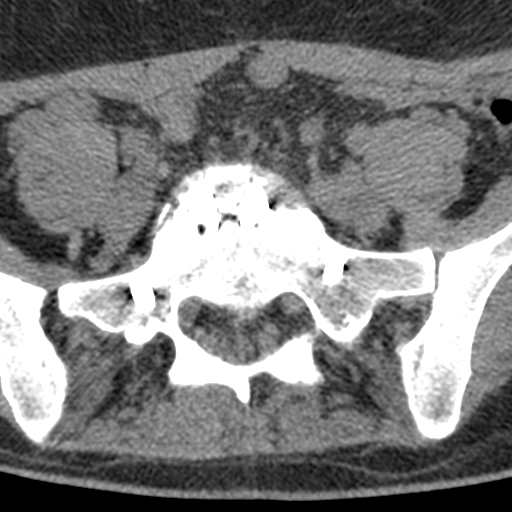
[im 17/114  bone]
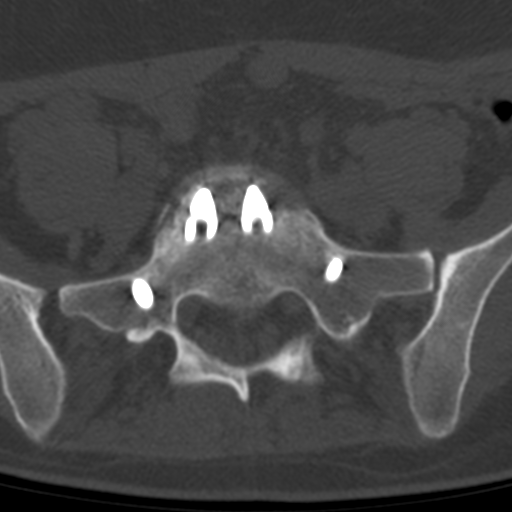
[im 33/114  bone]
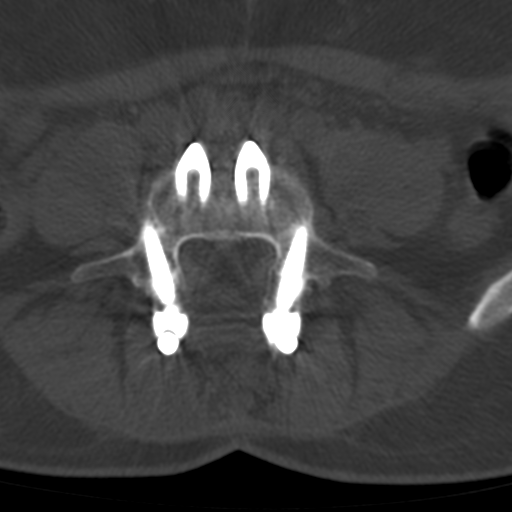
[im 49/114  bone]
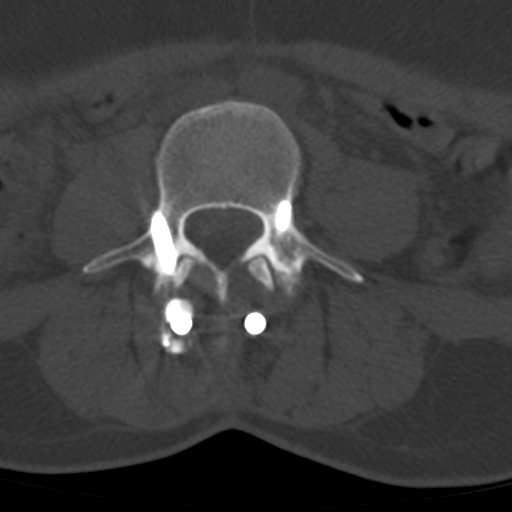
[im 65/114  bone]
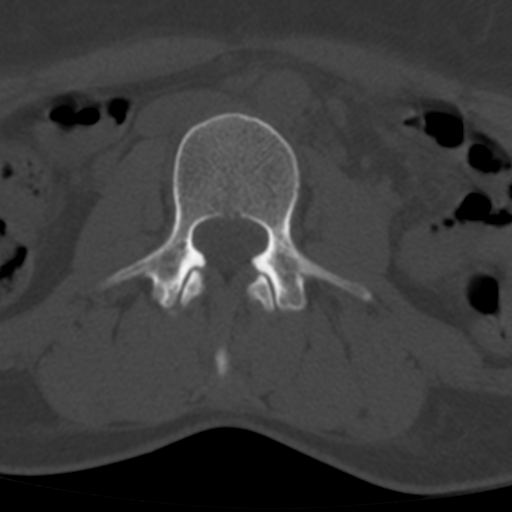
[im 81/114  soft-tissue]
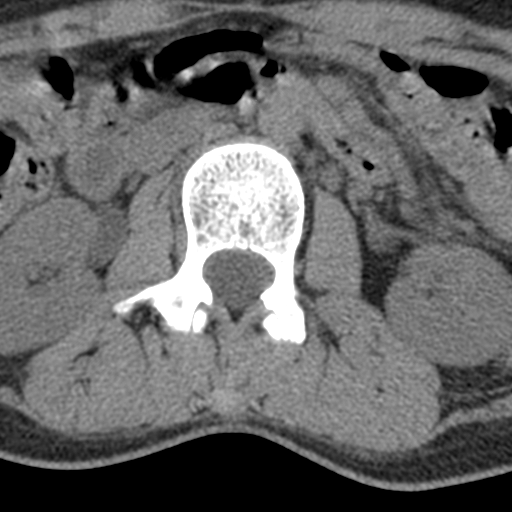
[im 81/114  bone]
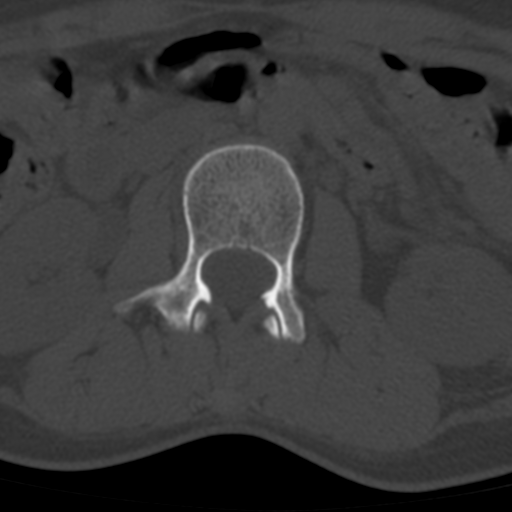
[im 97/114  bone]
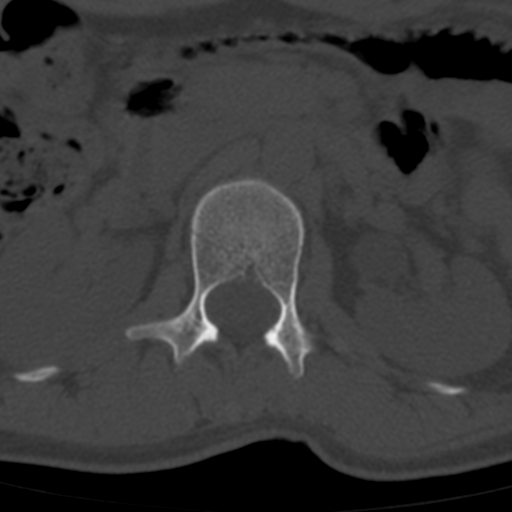

[6 of 14 positions shown; findings below may reference images not displayed]

FINDINGS: Segmentation: 5 lumbar type vertebral bodies as numbered previously.

Alignment: Normal

Vertebrae: No primary bone lesion. Previous discectomy and fusion
procedure from L4 to the sacrum. Solid union. See below.

Paraspinal and other soft tissues: No soft tissue lesion evident.
See below.

Disc levels: No abnormality at T1-2 or T2-3.

L3-4: No disc or facet arthropathy. Pedicle screw on the right at L4
breaches the superior cortical margin of the right pedicle and
passes into the lateral extraforaminal region. This comes very close
to the extraforaminal right L3 nerve. Is the patient's pain syndrome
consistent with right L3 distribution?

L4-5: Previous posterior decompression, diskectomy and fusion
procedure. There is solid union. Wide patency of the canal and
foramina at this level. See above discussion regarding the pedicle
screw on the right at L4, which could possibly affect the right L3
extraforaminal nerve.

L5-S1: Previous posterior decompression, diskectomy and fusion.
Solid union. Small amount of fusion bone posterior to the disc
space, but without visible encroachment upon the thecal sac or
foramina and not likely significant. Canal and foramina appear
widely patent at this level.

Mild osteoarthritis of both sacroiliac joints.
IMPRESSION: L3-4: No significant disc or facet pathology at this level. The
right pedicle screw at L4 breaches the superior cortex and extends
into the right extraforaminal region. This comes very close to the
right extraforaminal L3 nerve. Does the patient's pain syndrome have
L3 characteristics?

L4 through sacrum: Posterior decompression, diskectomy and fusion.
Solid union with wide patency of the canal and foramina at these 2
levels.

Mild sacroiliac osteoarthritis.

## 2023-05-29 DIAGNOSIS — M7711 Lateral epicondylitis, right elbow: Secondary | ICD-10-CM | POA: Diagnosis not present

## 2023-05-29 DIAGNOSIS — M5416 Radiculopathy, lumbar region: Secondary | ICD-10-CM | POA: Diagnosis not present

## 2023-07-03 DIAGNOSIS — H524 Presbyopia: Secondary | ICD-10-CM | POA: Diagnosis not present

## 2023-07-05 DIAGNOSIS — F988 Other specified behavioral and emotional disorders with onset usually occurring in childhood and adolescence: Secondary | ICD-10-CM | POA: Diagnosis not present

## 2023-07-26 DIAGNOSIS — R928 Other abnormal and inconclusive findings on diagnostic imaging of breast: Secondary | ICD-10-CM | POA: Diagnosis not present

## 2023-07-26 DIAGNOSIS — N6489 Other specified disorders of breast: Secondary | ICD-10-CM | POA: Diagnosis not present

## 2023-07-26 DIAGNOSIS — R92333 Mammographic heterogeneous density, bilateral breasts: Secondary | ICD-10-CM | POA: Diagnosis not present

## 2023-08-01 DIAGNOSIS — E782 Mixed hyperlipidemia: Secondary | ICD-10-CM | POA: Diagnosis not present

## 2023-08-01 DIAGNOSIS — F988 Other specified behavioral and emotional disorders with onset usually occurring in childhood and adolescence: Secondary | ICD-10-CM | POA: Diagnosis not present

## 2023-08-30 DIAGNOSIS — Z981 Arthrodesis status: Secondary | ICD-10-CM | POA: Diagnosis not present

## 2023-08-30 DIAGNOSIS — M4726 Other spondylosis with radiculopathy, lumbar region: Secondary | ICD-10-CM | POA: Diagnosis not present

## 2023-09-25 DIAGNOSIS — Z1151 Encounter for screening for human papillomavirus (HPV): Secondary | ICD-10-CM | POA: Diagnosis not present

## 2023-09-25 DIAGNOSIS — Z0000 Encounter for general adult medical examination without abnormal findings: Secondary | ICD-10-CM | POA: Diagnosis not present

## 2023-09-25 DIAGNOSIS — Z01419 Encounter for gynecological examination (general) (routine) without abnormal findings: Secondary | ICD-10-CM | POA: Diagnosis not present

## 2023-09-25 DIAGNOSIS — Z124 Encounter for screening for malignant neoplasm of cervix: Secondary | ICD-10-CM | POA: Diagnosis not present

## 2023-10-10 DIAGNOSIS — H60392 Other infective otitis externa, left ear: Secondary | ICD-10-CM | POA: Diagnosis not present

## 2023-10-10 DIAGNOSIS — Z6823 Body mass index (BMI) 23.0-23.9, adult: Secondary | ICD-10-CM | POA: Diagnosis not present

## 2023-10-11 DIAGNOSIS — H93239 Hyperacusis, unspecified ear: Secondary | ICD-10-CM | POA: Diagnosis not present

## 2023-10-11 DIAGNOSIS — H6123 Impacted cerumen, bilateral: Secondary | ICD-10-CM | POA: Diagnosis not present

## 2023-10-11 DIAGNOSIS — F988 Other specified behavioral and emotional disorders with onset usually occurring in childhood and adolescence: Secondary | ICD-10-CM | POA: Diagnosis not present

## 2023-12-07 DIAGNOSIS — Z0100 Encounter for examination of eyes and vision without abnormal findings: Secondary | ICD-10-CM | POA: Diagnosis not present

## 2024-02-04 DIAGNOSIS — R5383 Other fatigue: Secondary | ICD-10-CM | POA: Diagnosis not present

## 2024-02-04 DIAGNOSIS — E785 Hyperlipidemia, unspecified: Secondary | ICD-10-CM | POA: Diagnosis not present

## 2024-02-05 DIAGNOSIS — M5412 Radiculopathy, cervical region: Secondary | ICD-10-CM | POA: Diagnosis not present

## 2024-02-19 DIAGNOSIS — Z0000 Encounter for general adult medical examination without abnormal findings: Secondary | ICD-10-CM | POA: Diagnosis not present

## 2024-03-04 DIAGNOSIS — M5412 Radiculopathy, cervical region: Secondary | ICD-10-CM | POA: Diagnosis not present

## 2024-04-28 ENCOUNTER — Encounter: Payer: Self-pay | Admitting: Gastroenterology
